# Patient Record
Sex: Female | Born: 1970 | Race: Black or African American | Hispanic: No | Marital: Married
Health system: Southern US, Community
[De-identification: ages and names within clinical notes are randomized; demographics above are authoritative.]

## PROBLEM LIST (undated history)

## (undated) DIAGNOSIS — M1990 Unspecified osteoarthritis, unspecified site: Secondary | ICD-10-CM

## (undated) DIAGNOSIS — I10 Essential (primary) hypertension: Secondary | ICD-10-CM

## (undated) HISTORY — PX: KNEE SURGERY: SHX244

## (undated) HISTORY — PX: TUBAL LIGATION: SHX77

---

## 2002-10-08 ENCOUNTER — Other Ambulatory Visit: Admission: RE | Admit: 2002-10-08 | Discharge: 2002-10-08 | Payer: Self-pay

## 2003-09-11 ENCOUNTER — Inpatient Hospital Stay (HOSPITAL_COMMUNITY): Admission: RE | Admit: 2003-09-11 | Discharge: 2003-09-14 | Payer: Self-pay | Admitting: Orthopedic Surgery

## 2014-07-03 ENCOUNTER — Other Ambulatory Visit: Payer: Self-pay | Admitting: Orthopedic Surgery

## 2014-07-03 ENCOUNTER — Ambulatory Visit (HOSPITAL_COMMUNITY)
Admission: RE | Admit: 2014-07-03 | Discharge: 2014-07-03 | Disposition: A | Discharge status: Home / Self Care | Payer: Self-pay | Source: Ambulatory Visit | Attending: Orthopedic Surgery | Admitting: Orthopedic Surgery

## 2014-07-03 DIAGNOSIS — M25572 Pain in left ankle and joints of left foot: Secondary | ICD-10-CM | POA: Insufficient documentation

## 2014-07-03 DIAGNOSIS — M25571 Pain in right ankle and joints of right foot: Secondary | ICD-10-CM | POA: Insufficient documentation

## 2014-07-08 ENCOUNTER — Encounter: Payer: Self-pay | Admitting: Orthopedic Surgery

## 2014-07-08 ENCOUNTER — Ambulatory Visit (INDEPENDENT_AMBULATORY_CARE_PROVIDER_SITE_OTHER): Payer: BLUE CROSS/BLUE SHIELD | Admitting: Orthopedic Surgery

## 2014-07-08 VITALS — BP 133/83 | Ht 66.0 in | Wt 167.0 lb

## 2014-07-08 DIAGNOSIS — M722 Plantar fascial fibromatosis: Secondary | ICD-10-CM | POA: Diagnosis not present

## 2014-07-08 NOTE — Progress Notes (Signed)
New  Chief Complaint  Patient presents with  . Foot Pain    bilateral foot pain    43 y.o. History this is a [age] year old female 6670 month history of atraumatic pain in the plantar aspect of both heels. This started on the right is now progressed to the left. She describes pain swelling burning stabbing aching radiating sensation which is worse in the morning. She rates her pain 8 out of 10. She took Neurontin tried some rest and some topiramate but no improvement. Pain is worse with walking and with pressure and long sitting and then getting back up  Review of systems sinusitis joint pain swollen joints back pain otherwise normal  Previous medical history arthritis  Left and right knee surgeries tubal ligation actually had 2 left knee surgeries 1 2000 03/11/2003 medications are recorded  Allergies none  Family history diabetes stroke hypertension cancer arthritis  Nonsmoker occasional social drinker. She is employed as a Programmer, multimedianature assistant  BP 133/83 mmHg  Ht 5\' 6"  (1.676 m)  Wt 167 lb (75.751 kg)  BMI 26.97 kg/m2  LMP 06/17/2014 (Approximate) Appearance is normal she is oriented 3 grooming is excellent mood is normal gait is antalgic both heels  She can't really stand flat-footed. She has a normal arch. Ankle range of motion is normal stability of both ankles is normal motor exam is normal she has tenderness in the plantar aspect of both heels posterior tibial tendon is nontender scans intact no rash sensation and vascularity are normal bilaterally  X-rays of both ankles including a lateral of the heel show mild plantar spur on the left side  Impression plantar fasciitis  Recommend tuli  heel cups and physical therapy follow-up in 6 weeks

## 2014-07-08 NOTE — Patient Instructions (Addendum)
Take prescription to [facility] for heel cups  Call [facility] therapy dept to schedule visits

## 2014-07-21 ENCOUNTER — Telehealth: Payer: Self-pay | Admitting: Orthopedic Surgery

## 2014-07-21 NOTE — Telephone Encounter (Signed)
Yes

## 2014-07-21 NOTE — Telephone Encounter (Signed)
Routing to Dr Harrison 

## 2014-07-21 NOTE — Telephone Encounter (Signed)
Patient is supposed to start PT tomorrow at 4:45 and she states that she is having a lot of heel pain that radiates up into her ankle, there has been swelling over the weekend, which has went down some due to icing an elevation. She is asking if she should go ahead an start Therapy, please advise?

## 2014-07-21 NOTE — Telephone Encounter (Signed)
Patient aware.

## 2014-07-22 ENCOUNTER — Ambulatory Visit (HOSPITAL_COMMUNITY): Payer: BLUE CROSS/BLUE SHIELD | Attending: Orthopedic Surgery | Admitting: Physical Therapy

## 2014-07-22 DIAGNOSIS — M722 Plantar fascial fibromatosis: Secondary | ICD-10-CM | POA: Diagnosis present

## 2014-07-22 DIAGNOSIS — R269 Unspecified abnormalities of gait and mobility: Secondary | ICD-10-CM

## 2014-07-22 DIAGNOSIS — M25571 Pain in right ankle and joints of right foot: Secondary | ICD-10-CM | POA: Insufficient documentation

## 2014-07-22 DIAGNOSIS — M25671 Stiffness of right ankle, not elsewhere classified: Secondary | ICD-10-CM | POA: Diagnosis present

## 2014-07-22 DIAGNOSIS — M25672 Stiffness of left ankle, not elsewhere classified: Secondary | ICD-10-CM | POA: Diagnosis present

## 2014-07-22 DIAGNOSIS — M25572 Pain in left ankle and joints of left foot: Secondary | ICD-10-CM | POA: Insufficient documentation

## 2014-07-22 NOTE — Therapy (Signed)
[facility] [address] [place], [place], [address] Phone: [phone]   Fax:  [phone]  Physical Therapy Evaluation  Patient Details  Name: Jodi Gray MRN: [id number] Date of Birth: 10-10-1970 Referring Provider:  Vickki HearingHarrison, Stanley E, MD  Encounter Date: 07/22/2014      PT End of Session - 07/22/14 1745    Visit Number 1   Number of Visits 12   Date for PT Re-Evaluation 08/19/14   Authorization Type BCBS   Authorization Time Period 07/22/14 to 09/22/14   PT Start Time 1700   PT Stop Time 1740   PT Time Calculation (min) 40 min   Activity Tolerance Patient tolerated treatment well;Patient limited by pain   Behavior [facility] for tasks assessed/performed      No past medical history on file.  No past surgical history on file.  There were no vitals filed for this visit.  Visit Diagnosis:  Bilateral plantar fasciitis - Plan: PT plan of care cert/re-cert  Pain in joint, ankle and foot, left - Plan: PT plan of care cert/re-cert  Pain in joint, ankle and foot, right - Plan: PT plan of care cert/re-cert  Ankle and/or foot joint stiffness, left - Plan: PT plan of care cert/re-cert  Ankle and/or foot joint stiffness, right - Plan: PT plan of care cert/re-cert  Abnormality of gait - Plan: PT plan of care cert/re-cert      Subjective Assessment - 07/22/14 1702    Subjective L is usually more painful but patient still notices it in the right foot; pain can get up to 10/10 pain during the day. 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    Pertinent History Has had L knee replaced, also had [place] procedure done to R knee in past; L knee has trouble bending due to past surgeries. The plantar fasciitis started around 8 months ago; she had been working out elliptical and started having pain in feet. Pain did not diminish and was diagnosed with plantar fasciitis at family doctor. MD recommended new shoes.    How long can you stand comfortably? 5 minutes    How long can you walk comfortably? once she gets going, patient is OK; its the getting started that is the problem   Patient Stated Goals get rid of pain, get rid of swelling   Currently in Pain? Yes   Pain Score 5    Pain Location Foot   Pain Orientation Right;Left            [facility] PT Assessment - 07/22/14 0001    Assessment   Medical Diagnosis plantar fasciitis    Onset Date/Surgical Date --  8 months ago    Next MD Visit August 2016   Precautions   Precautions None   Restrictions   Weight Bearing Restrictions No   Balance Screen   Has the patient fallen in the past 6 months No   Has the patient had a decrease in activity level because of a fear of falling?  Yes   Is the patient reluctant to leave their home because of a fear of falling?  Yes   Prior Function   Level of Independence Independent;Independent with basic ADLs;Independent with gait;Independent with transfers   Vocation Full time employment   Vocation Requirements HR worker    Leisure reading, going to [employer], walking, bowling   Observation/Other Assessments   Focus on Therapeutic Outcomes (FOTO)  62% limited    Posture/Postural  Control   Posture Comments forward head, IR B shoulders, increased lumbar lordosis, reduced weight bearing L LE, low arches bilaterally   AROM   Right Hip External Rotation  --  full range    Right Hip Internal Rotation  35   Left Hip External Rotation  45   Left Hip Internal Rotation  40   Right Ankle Dorsiflexion 10   Right Ankle Plantar Flexion --  full range    Right Ankle Inversion 15   Right Ankle Eversion 15   Left Ankle Dorsiflexion 3   Left Ankle Plantar Flexion --  full range but painful    Left Ankle Inversion 16   Left Ankle Eversion 15   Strength   Right Hip Flexion 4/5   Right Hip Extension 4-/5   Right Hip ABduction 4-/5   Left Hip Flexion 5/5   Left Hip Extension 4-/5   Left Hip ABduction 4-/5   Right Knee  Flexion 4/5   Right Knee Extension 3+/5   Left Knee Flexion 5/5   Left Knee Extension 5/5   Right Ankle Dorsiflexion 5/5   Left Ankle Dorsiflexion 5/5   Palpation   Palpation comment moderate talar tightness L, mild calcaneal and midfoot tightness L; very mild tightness R foot in general    Ambulation/Gait   Gait Comments reduced gait speed, reduced push off and heel strike respectively, significant pronation bilaterally, proximal muscle weakness                            PT Education - 07/22/14 1744    Education provided Yes   Education Details prognosis, plan of care, HEP    Person(s) Educated Patient   Methods Explanation;Handout   Comprehension Verbalized understanding;Need further instruction          PT Short Term Goals - 07/22/14 1749    PT SHORT TERM GOAL #1   Title Patient will compliain of no more than 2/10 pain bilateral ankles and feet during all weight bearing activities    Time 3   Period Weeks   Status New   PT SHORT TERM GOAL #2   Title Patient will demonstrate bilateral ankle dorsiflexion of 20 degrees and increase bilateral inversion/eversion by at least 5 degrees    Time 3   Period Weeks   Status New   PT SHORT TERM GOAL #3   Title Patient will demonstrate the ability to bear weight equally down through lower extremities during gait with improved push off and heel strike, equal step lengths for unlimited distances    Time 3   Period Weeks   Status New   PT SHORT TERM GOAL #4   Title Patient will be independent in correctly and consistently performing appropriate HEP, to be updated PRN    Time 3   Period Weeks   Status New           PT Long Term Goals - 07/22/14 1751    PT LONG TERM GOAL #1   Title Patient will experience 0/10 pain in bilateral feet with no lateral pain L ankle during all functional weight bearing tasks for unlimited amounts of time    Time 6   Period Weeks   Status New   PT LONG TERM GOAL #2   Title  Patient will be able to perform static stance activites for at least 60 minnutes with pain 0/10 and equal weight bearing    Time 6  Period Weeks   Status New   PT LONG TERM GOAL #3   Title Patient will be able to ambulate over even and uneven surfaces with pain 0/10 for unlimited amounts of time, no fatigue, WNL gait mechanics    Time 6   Period Weeks   Status New   PT LONG TERM GOAL #4   Title Patient will be able to maintain SLS on foam pad with no HHA for at least 30 seconds each LE with pain no more than 1/10   Time 6   Period Weeks   Status New   PT LONG TERM GOAL #5   Title Patient will report that she has been able to exercise on elliptical machine for at least 20 minutes a day, at least 3 days a week with pain no more than 1/10 during or after activity    Time 6   Period Weeks   Status New               Plan - 07/22/14 1745    Clinical Impression Statement Patient presents with bilateral plantar fasciitis L greater than R, bilateral ankle and hip stiffness, pain bilateral ankles L greater than R, impaired posture, impaired gait, and reduced functional activity tolerance due to pain. Patient reports that her L is much worse than the R and that she also has new pain running up the lateral side of her L ankle, which PT will monitor and address if needed during her course of care. Also note that she displays significant edema in bialteral ankles at this time, again L greater than R. Patient wil benefit from skilled PT services in order to address these deficits and assist her in reaching an optimal level of function.    Pt will benefit from skilled therapeutic intervention in order to improve on the following deficits Abnormal gait;Decreased coordination;Decreased range of motion;Difficulty walking;Increased fascial restricitons;Decreased endurance;Decreased activity tolerance;Pain;Decreased balance;Hypomobility;Decreased mobility;Decreased strength;Increased edema;Postural  dysfunction   Rehab Potential Good   PT Frequency 2x / week   PT Duration 6 weeks   PT Treatment/Interventions ADLs/Self Care Home Management;Moist Heat;Cryotherapy;Ultrasound;Gait training;Stair training;Functional mobility training;Therapeutic activities;Therapeutic exercise;Balance training;Neuromuscular re-education;Patient/family education;Manual techniques   PT Next Visit Plan review HEP and goals; functional stretching, manual, exercises as tolerated    PT Home Exercise Plan given    Consulted and Agree with Plan of Care Patient         Problem List There are no active problems to display for this patient.   Nedra Hai PT, DPT [phone]  [facility] [address] [place], [place], [address] Phone: [phone]   Fax:  [phone]

## 2014-07-22 NOTE — Patient Instructions (Signed)
Ankle Pumps   Lie with knees supported on bolster or sit. Straighten one knee. Keep knee straight; alternately press out and pull up heel. Emphasize movement of heel. Repeat _10__ times, twice a day.   Copyright  VHI. All rights reserved.   Ankle Eversion   With ankle movement only, move left foot so sole of foot faces outward. Repeat __10__ times per session. Do __2__ sessions per day . Position: Sitting   Copyright  VHI. All rights reserved.   Ankle Inversion   With ankle movement only, move left foot so sole of foot faces inward. Repeat __10__ times per session. Do _2___ sessions per day. Position: Seated  Copyright  VHI. All rights reserved.   Ankle Circles   Slowly rotate right foot and ankle clockwise then counterclockwise. Gradually increase range of motion. Avoid pain. Circle __10__ times each direction per set. Do _1___ sets per session. Do _2___ sessions per day.  http://orth.exer.us/31   Copyright  VHI. All rights reserved.   STRETCH FOR THE FOOT  Stand at your stairs with just your balls on the stair and let your heels hang off of the side, so that you feel a stretch in the bottom of your feet. Hold 30 seconds, 2 times, twice a day.

## 2014-07-23 ENCOUNTER — Ambulatory Visit (HOSPITAL_COMMUNITY): Payer: BLUE CROSS/BLUE SHIELD | Admitting: Physical Therapy

## 2014-07-23 DIAGNOSIS — M25671 Stiffness of right ankle, not elsewhere classified: Secondary | ICD-10-CM

## 2014-07-23 DIAGNOSIS — M722 Plantar fascial fibromatosis: Secondary | ICD-10-CM | POA: Diagnosis not present

## 2014-07-23 DIAGNOSIS — M25571 Pain in right ankle and joints of right foot: Secondary | ICD-10-CM

## 2014-07-23 DIAGNOSIS — M25572 Pain in left ankle and joints of left foot: Secondary | ICD-10-CM

## 2014-07-23 DIAGNOSIS — R269 Unspecified abnormalities of gait and mobility: Secondary | ICD-10-CM

## 2014-07-23 DIAGNOSIS — M25672 Stiffness of left ankle, not elsewhere classified: Secondary | ICD-10-CM

## 2014-07-23 NOTE — Therapy (Signed)
[facility] [address] [place], [place], [address] Phone: [phone]   Fax:  [phone]  Physical Therapy Treatment  Patient Details  Name: Jodi Gray MRN: [id number] Date of Birth: 08-14-1970 Referring Provider:  Vickki HearingHarrison, Stanley E, MD  Encounter Date: 07/23/2014      PT End of Session - 07/23/14 1635    Visit Number 2   Number of Visits 12   Date for PT Re-Evaluation 08/19/14   Authorization Type BCBS   Authorization Time Period 07/22/14 to 09/22/14   PT Start Time 1530   PT Stop Time 1615   PT Time Calculation (min) 45 min   Activity Tolerance Patient tolerated treatment well;Patient limited by pain   Behavior During Therapy [facility] for tasks assessed/performed      No past medical history on file.  No past surgical history on file.  There were no vitals filed for this visit.  Visit Diagnosis:  Bilateral plantar fasciitis  Pain in joint, ankle and foot, left  Pain in joint, ankle and foot, right  Ankle and/or foot joint stiffness, left  Ankle and/or foot joint stiffness, right  Abnormality of gait      Subjective Assessment - 07/23/14 1629    Subjective Pt reports she is late due to waiting on her ride to get here.  States her Lt foot is hurting 6/10 and her Rt is not hurting at all today.  PT is just coming from work.  States she's been doing her HEP and is planning on going to get new shoes.   Currently in Pain? Yes   Pain Score 5    Pain Location Foot   Pain Orientation Left   Pain Descriptors / Indicators Burning                         [facility] Adult PT Treatment/Exercise - 07/23/14 1632    Modalities   Modalities Cryotherapy   Cryotherapy   Number Minutes Cryotherapy 10 Minutes   Cryotherapy Location Other (comment)   Type of Cryotherapy Ice massage   Manual Therapy   Manual Therapy Joint mobilization;Soft tissue mobilization;Myofascial release   Manual therapy comments focus to  lateral heel and moving towards plantar fascia Lt foot in prone with stretch   Joint Mobilization to increase forefoot and ankle mobility   Ankle Exercises: Stretches   Plantar Fascia Stretch 3 reps;30 seconds   Plantar Fascia Stretch Limitations bottom step   Gastroc Stretch 3 reps;30 seconds   Gastroc Stretch Limitations slant board   Other Stretch PROM with manual techniques                PT Education - 07/22/14 1744    Education provided Yes   Education Details Written evaluation measurements and goals   Person(s) Educated Patient   Methods Explanation;Handout   Comprehension Verbalized understanding;Need further instruction          PT Short Term Goals - 07/22/14 1749    PT SHORT TERM GOAL #1   Title Patient will compliain of no more than 2/10 pain bilateral ankles and feet during all weight bearing activities    Time 3   Period Weeks   Status New   PT SHORT TERM GOAL #2   Title Patient will demonstrate bilateral ankle dorsiflexion of 20 degrees and increase bilateral inversion/eversion by at least 5 degrees    Time 3   Period Weeks   Status New  PT SHORT TERM GOAL #3   Title Patient will demonstrate the ability to bear weight equally down through lower extremities during gait with improved push off and heel strike, equal step lengths for unlimited distances    Time 3   Period Weeks   Status New   PT SHORT TERM GOAL #4   Title Patient will be independent in correctly and consistently performing appropriate HEP, to be updated PRN    Time 3   Period Weeks   Status New           PT Long Term Goals - 07/22/14 1751    PT LONG TERM GOAL #1   Title Patient will experience 0/10 pain in bilateral feet with no lateral pain L ankle during all functional weight bearing tasks for unlimited amounts of time    Time 6   Period Weeks   Status New   PT LONG TERM GOAL #2   Title Patient will be able to perform static stance activites for at least 60 minnutes with  pain 0/10 and equal weight bearing    Time 6   Period Weeks   Status New   PT LONG TERM GOAL #3   Title Patient will be able to ambulate over even and uneven surfaces with pain 0/10 for unlimited amounts of time, no fatigue, WNL gait mechanics    Time 6   Period Weeks   Status New   PT LONG TERM GOAL #4   Title Patient will be able to maintain SLS on foam pad with no HHA for at least 30 seconds each LE with pain no more than 1/10   Time 6   Period Weeks   Status New   PT LONG TERM GOAL #5   Title Patient will report that she has been able to exercise on elliptical machine for at least 20 minutes a day, at least 3 days a week with pain no more than 1/10 during or after activity    Time 6   Period Weeks   Status New               Plan - 07/23/14 1641    Clinical Impression Statement Pt given copy of initial evaluation with explanation of measurements and goals.  Initiated stretches for plantar fascia and gastroc musculature.  Pt with mild edema noted in Lt lateral ankle but no other areas of swelling noted.  Completed manual therapy including myofascial techniques, soft tissue massage and joint mobs to forefoot and ankle.  Most tenderness and pain located in lateral Lt heel today.  Added ice massage to this area at end of session to further decrease the pain.  Pt reported no difference in pain at end of session    PT Next Visit Plan Progress with functional stretching, manual, exercises as tolerated .  Update HEP as needed.   Consulted and Agree with Plan of Care Patient        Problem List There are no active problems to display for this patient.   Lurena Nida, PTA/CLT [phone]  07/23/2014, 4:46 PM  [facility] [address] [place], [place], [address] Phone: [phone]   Fax:  [phone]

## 2014-07-24 ENCOUNTER — Encounter: Payer: Self-pay | Admitting: Orthopedic Surgery

## 2014-07-29 ENCOUNTER — Ambulatory Visit (HOSPITAL_COMMUNITY): Payer: BLUE CROSS/BLUE SHIELD | Admitting: Physical Therapy

## 2014-07-29 DIAGNOSIS — M25671 Stiffness of right ankle, not elsewhere classified: Secondary | ICD-10-CM

## 2014-07-29 DIAGNOSIS — M722 Plantar fascial fibromatosis: Secondary | ICD-10-CM

## 2014-07-29 DIAGNOSIS — M25672 Stiffness of left ankle, not elsewhere classified: Secondary | ICD-10-CM

## 2014-07-29 DIAGNOSIS — M25572 Pain in left ankle and joints of left foot: Secondary | ICD-10-CM

## 2014-07-29 DIAGNOSIS — M25571 Pain in right ankle and joints of right foot: Secondary | ICD-10-CM

## 2014-07-29 NOTE — Therapy (Signed)
[facility] [address] [place], [place], [address] Phone: [phone]   Fax:  [phone]  Physical Therapy Treatment  Patient Details  Name: Jodi Gray MRN: [id number] Date of Birth: 1970/08/22 Referring Provider:  Vickki HearingHarrison, Stanley E, MD  Encounter Date: 07/29/2014      PT End of Session - 07/29/14 1207    Visit Number 3   Number of Visits 12   Date for PT Re-Evaluation 08/19/14   Authorization Type BCBS   Authorization Time Period 07/22/14 to 09/22/14   PT Start Time 1015   PT Stop Time 1057   PT Time Calculation (min) 42 min   Activity Tolerance Patient tolerated treatment well   Behavior During Therapy [facility] for tasks assessed/performed      No past medical history on file.  No past surgical history on file.  There were no vitals filed for this visit.  Visit Diagnosis:  Bilateral plantar fasciitis  Pain in joint, ankle and foot, left  Pain in joint, ankle and foot, right  Ankle and/or foot joint stiffness, left  Ankle and/or foot joint stiffness, right      Subjective Assessment - 07/29/14 1017    Subjective Pt reports that she has pain in bilateral feet today, which is the worst when she is walking. She rates pain as 8/10 when she is ambulating. Pt reports that the pain in her R foot was mild until she began PT.    Currently in Pain? Yes   Pain Score 8    Pain Location Foot   Pain Orientation Right;Left   Pain Descriptors / Indicators Burning             [facility] Adult PT Treatment/Exercise - 07/29/14 0001    Manual Therapy   Manual Therapy Joint mobilization;Soft tissue mobilization;Myofascial release   Manual therapy comments focus to lateral heel and moving towards plantar fascia Lt foot in prone with stretch   Joint Mobilization to increase forefoot and ankle mobility   Ankle Exercises: Stretches   Plantar Fascia Stretch 3 reps;30 seconds   Plantar Fascia Stretch Limitations bottom step   Gastroc Stretch 3 reps;30 seconds   Gastroc Stretch Limitations slant board   Other Stretch PROM with manual techniques   Ankle Exercises: Standing   Rocker Board 2 minutes  A/P   Other Standing Ankle Exercises standing ankle excursions x 10                PT Education - 07/29/14 1206    Education provided Yes   Education Details Educated on using tennis ball or frozen water bottle to decrease tightness in plantar fascia   Person(s) Educated Patient   Methods Explanation   Comprehension Verbalized understanding          PT Short Term Goals - 07/22/14 1749    PT SHORT TERM GOAL #1   Title Patient will compliain of no more than 2/10 pain bilateral ankles and feet during all weight bearing activities    Time 3   Period Weeks   Status New   PT SHORT TERM GOAL #2   Title Patient will demonstrate bilateral ankle dorsiflexion of 20 degrees and increase bilateral inversion/eversion by at least 5 degrees    Time 3   Period Weeks   Status New   PT SHORT TERM GOAL #3   Title Patient will demonstrate the ability to bear weight equally down through lower extremities during gait with improved push off and heel  strike, equal step lengths for unlimited distances    Time 3   Period Weeks   Status New   PT SHORT TERM GOAL #4   Title Patient will be independent in correctly and consistently performing appropriate HEP, to be updated PRN    Time 3   Period Weeks   Status New           PT Long Term Goals - 07/22/14 1751    PT LONG TERM GOAL #1   Title Patient will experience 0/10 pain in bilateral feet with no lateral pain L ankle during all functional weight bearing tasks for unlimited amounts of time    Time 6   Period Weeks   Status New   PT LONG TERM GOAL #2   Title Patient will be able to perform static stance activites for at least 60 minnutes with pain 0/10 and equal weight bearing    Time 6   Period Weeks   Status New   PT LONG TERM GOAL #3   Title Patient will  be able to ambulate over even and uneven surfaces with pain 0/10 for unlimited amounts of time, no fatigue, WNL gait mechanics    Time 6   Period Weeks   Status New   PT LONG TERM GOAL #4   Title Patient will be able to maintain SLS on foam pad with no HHA for at least 30 seconds each LE with pain no more than 1/10   Time 6   Period Weeks   Status New   PT LONG TERM GOAL #5   Title Patient will report that she has been able to exercise on elliptical machine for at least 20 minutes a day, at least 3 days a week with pain no more than 1/10 during or after activity    Time 6   Period Weeks   Status New               Plan - 07/29/14 1209    Clinical Impression Statement Treatment focused on improving range of motion and mobility of bilateral feet/ankles. Pt responded well to manual therapy, reporting decreased pain with soft tissue mobilization, however, her pain increased again with ankle excursions. Pt will benefit from continued manual therapy and ankle ROM activities   PT Next Visit Plan Continue with manual, add ankle ABCs and seated BAPS        Problem List There are no active problems to display for this patient.   Leona Singleton, PT, DPT [phone]  07/29/2014, 12:15 PM  [facility] [address] [place], [place], [address] Phone: [phone]   Fax:  [phone]

## 2014-08-01 ENCOUNTER — Ambulatory Visit (HOSPITAL_COMMUNITY): Payer: BLUE CROSS/BLUE SHIELD

## 2014-08-01 DIAGNOSIS — M25572 Pain in left ankle and joints of left foot: Secondary | ICD-10-CM

## 2014-08-01 DIAGNOSIS — M722 Plantar fascial fibromatosis: Secondary | ICD-10-CM | POA: Diagnosis not present

## 2014-08-01 DIAGNOSIS — M25672 Stiffness of left ankle, not elsewhere classified: Secondary | ICD-10-CM

## 2014-08-01 DIAGNOSIS — M25571 Pain in right ankle and joints of right foot: Secondary | ICD-10-CM

## 2014-08-01 DIAGNOSIS — R269 Unspecified abnormalities of gait and mobility: Secondary | ICD-10-CM

## 2014-08-01 DIAGNOSIS — M25671 Stiffness of right ankle, not elsewhere classified: Secondary | ICD-10-CM

## 2014-08-01 NOTE — Therapy (Signed)
[facility] [address] [place], [place], [address] Phone: [phone]   Fax:  [phone]  Physical Therapy Treatment  Patient Details  Name: Jodi Gray MRN: [id number] Date of Birth: 16-Aug-1970 Referring Provider:  Vickki Hearing, MD  Encounter Date: 08/01/2014      PT End of Session - 08/01/14 1729    Visit Number 4   Number of Visits 12   Date for PT Re-Evaluation 08/19/14   Authorization Type BCBS   Authorization Time Period 07/22/14 to 09/22/14   PT Start Time 1730   PT Stop Time 1825   PT Time Calculation (min) 55 min   Activity Tolerance Patient tolerated treatment well;Patient limited by pain   Behavior During Therapy [facility] for tasks assessed/performed      No past medical history on file.  No past surgical history on file.  There were no vitals filed for this visit.  Visit Diagnosis:  Bilateral plantar fasciitis  Pain in joint, ankle and foot, left  Pain in joint, ankle and foot, right  Ankle and/or foot joint stiffness, left  Ankle and/or foot joint stiffness, right  Abnormality of gait      Subjective Assessment - 08/01/14 1730    Subjective Pt stated she has been at work 8 hours today with increased pain Rt>Lt feet with burning, increased pain with weight bearing.  Lt foot is swollen today, reports she has been applying ice for relief   Currently in Pain? Yes   Pain Score 9    Pain Location Ankle   Pain Orientation Right;Left   Pain Descriptors / Indicators Burning;Pins and needles              [facility] Adult PT Treatment/Exercise - 08/01/14 0001    Exercises   Exercises Ankle   Manual Therapy   Manual Therapy Joint mobilization;Soft tissue mobilization;Myofascial release   Manual therapy comments focus to lateral heel and moving towards plantar fascia Lt foot in prone with stretch   Joint Mobilization to increase forefoot and ankle mobility   Ankle Exercises: Stretches   Plantar  Fascia Stretch 3 reps;30 seconds   Plantar Fascia Stretch Limitations bottom step   Gastroc Stretch 3 reps;30 seconds   Gastroc Stretch Limitations slant board   Other Stretch PROM with manual techniques   Ankle Exercises: Standing   Rocker Board 2 minutes  R/L and A/P   Other Standing Ankle Exercises standing ankle excursions x 10   Ankle Exercises: Seated   ABC's 2 reps   ABC's Limitations therapist facilitation to reduce compensation   BAPS Sitting;Level 2;5 reps   BAPS Limitations R/L, A/P, CW/CCW                  PT Short Term Goals - 08/01/14 1729    PT SHORT TERM GOAL #1   Title Patient will compliain of no more than 2/10 pain bilateral ankles and feet during all weight bearing activities    PT SHORT TERM GOAL #2   Title Patient will demonstrate bilateral ankle dorsiflexion of 20 degrees and increase bilateral inversion/eversion by at least 5 degrees    PT SHORT TERM GOAL #3   Title Patient will demonstrate the ability to bear weight equally down through lower extremities during gait with improved push off and heel strike, equal step lengths for unlimited distances    PT SHORT TERM GOAL #4   Title Patient will be independent in correctly and consistently performing appropriate HEP, to  be updated PRN    Status On-going           PT Long Term Goals - 08/01/14 1730    PT LONG TERM GOAL #1   Title Patient will experience 0/10 pain in bilateral feet with no lateral pain L ankle during all functional weight bearing tasks for unlimited amounts of time    PT LONG TERM GOAL #2   Title Patient will be able to perform static stance activites for at least 60 minnutes with pain 0/10 and equal weight bearing    PT LONG TERM GOAL #3   Title Patient will be able to ambulate over even and uneven surfaces with pain 0/10 for unlimited amounts of time, no fatigue, WNL gait mechanics    PT LONG TERM GOAL #4   Title Patient will be able to maintain SLS on foam pad with no HHA for  at least 30 seconds each LE with pain no more than 1/10   PT LONG TERM GOAL #5   Title Patient will report that she has been able to exercise on elliptical machine for at least 20 minutes a day, at least 3 days a week with pain no more than 1/10 during or after activity                Plan - 08/01/14 1828    Clinical Impression Statement Session focus on improving Bil ankle mobilty to improve ROM and manual technqiues for pain control.  Added seated BAPS board and ankle ABC's to improve ROM with therapist facilitation to reduce compensations with hip mobilty.  Ended session with manual therapy with PROM per tolerance and MFR techniques to reduce fascial restrictions with increased tenderness to Lt heel.  End of session pt reported decrease in pain scale to 3-4/10 Bil ankles.     PT Next Visit Plan Continue with ankle mobiltyi to improve ROM, review ABCs to assure ankle movements with no compensation, increase BAPS board to L3 and continue manual.          Problem List There are no active problems to display for this patient.  [address] LPTA; CBIS [phone]  [name] 08/01/2014, 6:34 PM  [facility] [address] [place], [place], [address] Phone: [phone]   Fax:  [phone]

## 2014-08-05 ENCOUNTER — Ambulatory Visit (HOSPITAL_COMMUNITY): Payer: BLUE CROSS/BLUE SHIELD | Admitting: Physical Therapy

## 2014-08-12 ENCOUNTER — Ambulatory Visit (HOSPITAL_COMMUNITY): Payer: BLUE CROSS/BLUE SHIELD | Attending: Orthopedic Surgery

## 2014-08-12 DIAGNOSIS — M722 Plantar fascial fibromatosis: Secondary | ICD-10-CM

## 2014-08-12 DIAGNOSIS — M25572 Pain in left ankle and joints of left foot: Secondary | ICD-10-CM | POA: Diagnosis present

## 2014-08-12 DIAGNOSIS — R269 Unspecified abnormalities of gait and mobility: Secondary | ICD-10-CM

## 2014-08-12 DIAGNOSIS — M25672 Stiffness of left ankle, not elsewhere classified: Secondary | ICD-10-CM | POA: Insufficient documentation

## 2014-08-12 DIAGNOSIS — M25671 Stiffness of right ankle, not elsewhere classified: Secondary | ICD-10-CM

## 2014-08-12 DIAGNOSIS — M25571 Pain in right ankle and joints of right foot: Secondary | ICD-10-CM | POA: Insufficient documentation

## 2014-08-12 NOTE — Therapy (Signed)
[facility] [address] [place], [place], [address] Phone: [phone]   Fax:  [phone]  Physical Therapy Treatment  Patient Details  Name: Jodi Gray MRN: [id number] Date of Birth: 1970-04-02 Referring Provider:  Toma Deiters, MD  Encounter Date: 08/12/2014      PT End of Session - 08/12/14 1701    Visit Number 5   Number of Visits 12   Date for PT Re-Evaluation 08/19/14   Authorization Type BCBS   Authorization Time Period 07/22/14 to 09/22/14   PT Start Time 1650   PT Stop Time 1730   PT Time Calculation (min) 40 min   Activity Tolerance Patient tolerated treatment well   Behavior During Therapy [facility] for tasks assessed/performed      No past medical history on file.  No past surgical history on file.  There were no vitals filed for this visit.  Visit Diagnosis:  Bilateral plantar fasciitis  Pain in joint, ankle and foot, left  Pain in joint, ankle and foot, right  Ankle and/or foot joint stiffness, left  Ankle and/or foot joint stiffness, right  Abnormality of gait      Subjective Assessment - 08/12/14 1652    Subjective Pt stated she was increased pain last week, went to podiatrist and got shot in Bil plantar fascia ankles.     Currently in Pain? Yes   Pain Score 2    Pain Location Ankle   Pain Orientation Right;Left   Pain Descriptors / Indicators Burning             [facility] Adult PT Treatment/Exercise - 08/12/14 0001    Modalities   Modalities Ultrasound   Ultrasound   Ultrasound Location BLE heel    Ultrasound Parameters 1.2 w/cm2 100% ; each   Ultrasound Goals Pain   Manual Therapy   Manual Therapy Joint mobilization;Soft tissue mobilization;Myofascial release   Manual therapy comments focus to lateral heel and moving towards plantar fascia Lt foot in prone with stretch   Joint Mobilization to increase forefoot and ankle mobility   Ankle Exercises: Stretches   Plantar Fascia  Stretch 3 reps;30 seconds   Plantar Fascia Stretch Limitations bottom step   Gastroc Stretch 3 reps;30 seconds   Gastroc Stretch Limitations slant board   Other Stretch PROM with manual techniques   Ankle Exercises: Seated   ABC's 1 rep  Bil ankles   BAPS Sitting;Level 3;10 reps   BAPS Limitations R/L, A/P, CW/CCW                  PT Short Term Goals - 08/12/14 1701    PT SHORT TERM GOAL #1   Title Patient will compliain of no more than 2/10 pain bilateral ankles and feet during all weight bearing activities    Status On-going   PT SHORT TERM GOAL #2   Title Patient will demonstrate bilateral ankle dorsiflexion of 20 degrees and increase bilateral inversion/eversion by at least 5 degrees    Status On-going   PT SHORT TERM GOAL #3   Title Patient will demonstrate the ability to bear weight equally down through lower extremities during gait with improved push off and heel strike, equal step lengths for unlimited distances    Status On-going   PT SHORT TERM GOAL #4   Title Patient will be independent in correctly and consistently performing appropriate HEP, to be updated PRN    Status On-going  PT Long Term Goals - 08/12/14 1703    PT LONG TERM GOAL #1   Title Patient will experience 0/10 pain in bilateral feet with no lateral pain L ankle during all functional weight bearing tasks for unlimited amounts of time    PT LONG TERM GOAL #2   Title Patient will be able to perform static stance activites for at least 60 minnutes with pain 0/10 and equal weight bearing    PT LONG TERM GOAL #3   Title Patient will be able to ambulate over even and uneven surfaces with pain 0/10 for unlimited amounts of time, no fatigue, WNL gait mechanics    PT LONG TERM GOAL #4   Title Patient will be able to maintain SLS on foam pad with no HHA for at least 30 seconds each LE with pain no more than 1/10   PT LONG TERM GOAL #5   Title Patient will report that she has been able to  exercise on elliptical machine for at least 20 minutes a day, at least 3 days a week with pain no more than 1/10 during or after activity                Plan - 08/12/14 1742    Clinical Impression Statement Continued session focus on improving Bil ankle mobility, ROM and pain control.  Increased to L3 seated BAPS board to improve ankle ROM with therapist facilitation to reduce compensation with hip and knee mobility.  Pt stated increased pain following manual last session, following discussion with DPT added continuous [place] to plantar fascia aspect BLE for pain controll, did continue with PROM stretches to improve ankle mobility  End of session pt stated pain reduced to not even 1/10 Bil ankles.     PT Next Visit Plan Continue with ankle mobiltiy to increase ROM, F/u with [place] next session.         Problem List There are no active problems to display for this patient.  [address] LPTA; CBIS [phone]  [name] 08/12/2014, 5:51 PM  [facility] [address] [place], [place], [address] Phone: [phone]   Fax:  [phone]

## 2014-08-13 ENCOUNTER — Ambulatory Visit (HOSPITAL_COMMUNITY): Payer: BLUE CROSS/BLUE SHIELD | Admitting: Physical Therapy

## 2014-08-13 ENCOUNTER — Telehealth (HOSPITAL_COMMUNITY): Payer: Self-pay | Admitting: Physical Therapy

## 2014-08-13 NOTE — Telephone Encounter (Signed)
Pt called she is in too much pain to come in today

## 2014-08-15 ENCOUNTER — Encounter: Payer: Self-pay | Admitting: Orthopedic Surgery

## 2014-08-18 ENCOUNTER — Ambulatory Visit (HOSPITAL_COMMUNITY): Payer: BLUE CROSS/BLUE SHIELD | Admitting: Physical Therapy

## 2014-08-18 DIAGNOSIS — M722 Plantar fascial fibromatosis: Secondary | ICD-10-CM

## 2014-08-18 DIAGNOSIS — R269 Unspecified abnormalities of gait and mobility: Secondary | ICD-10-CM

## 2014-08-18 DIAGNOSIS — M25672 Stiffness of left ankle, not elsewhere classified: Secondary | ICD-10-CM

## 2014-08-18 DIAGNOSIS — M25671 Stiffness of right ankle, not elsewhere classified: Secondary | ICD-10-CM

## 2014-08-18 DIAGNOSIS — M25572 Pain in left ankle and joints of left foot: Secondary | ICD-10-CM

## 2014-08-18 DIAGNOSIS — M25571 Pain in right ankle and joints of right foot: Secondary | ICD-10-CM

## 2014-08-18 NOTE — Therapy (Signed)
[facility] [address] [place], [place], [address] Phone: [phone]   Fax:  [phone]  Physical Therapy Treatment  Patient Details  Name: Jodi Gray MRN: [id number] Date of Birth: December 03, 1970 Referring Provider:  Vickki Hearing, MD  Encounter Date: 08/18/2014      PT End of Session - 08/18/14 1754    Visit Number 6   Number of Visits 12   Date for PT Re-Evaluation 08/19/14   Authorization Type BCBS   Authorization Time Period 07/22/14 to 09/22/14   PT Start Time 1700   PT Stop Time 1747   PT Time Calculation (min) 47 min   Activity Tolerance Patient tolerated treatment well   Behavior During Therapy [facility] for tasks assessed/performed      No past medical history on file.  No past surgical history on file.  There were no vitals filed for this visit.  Visit Diagnosis:  Bilateral plantar fasciitis  Pain in joint, ankle and foot, left  Pain in joint, ankle and foot, right  Ankle and/or foot joint stiffness, left  Ankle and/or foot joint stiffness, right  Abnormality of gait      Subjective Assessment - 08/18/14 1702    Subjective Patient reports she is having increased pain today, around 5/10; reports that the shots worked well for the first few days and then things were back to the same    Pertinent History Has had L knee replaced, also had [place] procedure done to R knee in past; L knee has trouble bending due to past surgeries. The plantar fasciitis started around 8 months ago; she had been working out elliptical and started having pain in feet. Pain did not diminish and was diagnosed with plantar fasciitis at family doctor. MD recommended new shoes.    Currently in Pain? Yes   Pain Score 5    Pain Orientation Right;Left  left worse than R                          [facility] Adult PT Treatment/Exercise - 08/18/14 0001    Manual Therapy   Manual Therapy Joint mobilization   Joint  Mobilization midfoot mobilzation, calcaneal mobilzation, talar mobilization, cuboid whip mobilizations alll grade 3 to tolerance    Ankle Exercises: Stretches   Plantar Fascia Stretch 3 reps;30 seconds   Plantar Fascia Stretch Limitations bottom step   Gastroc Stretch 3 reps;30 seconds   Gastroc Stretch Limitations slant board   Other Stretch hamstring and piriformis stretches 2x30 each side    Ankle Exercises: Seated   ABC's 2 reps   BAPS Sitting;Level 3   BAPS Limitations CCW, CW, R/L, A/P   Other Seated Ankle Exercises 3D ankle excursions 1x15   Ankle Exercises: Standing   Rocker Board 2 minutes                PT Education - 08/18/14 1754    Education provided Yes   Education Details educated regarding retrograde massage, contrast baths to reduce edema    Person(s) Educated Patient   Methods Explanation   Comprehension Verbalized understanding          PT Short Term Goals - 08/12/14 1701    PT SHORT TERM GOAL #1   Title Patient will compliain of no more than 2/10 pain bilateral ankles and feet during all weight bearing activities    Status On-going   PT SHORT TERM GOAL #2   Title Patient  will demonstrate bilateral ankle dorsiflexion of 20 degrees and increase bilateral inversion/eversion by at least 5 degrees    Status On-going   PT SHORT TERM GOAL #3   Title Patient will demonstrate the ability to bear weight equally down through lower extremities during gait with improved push off and heel strike, equal step lengths for unlimited distances    Status On-going   PT SHORT TERM GOAL #4   Title Patient will be independent in correctly and consistently performing appropriate HEP, to be updated PRN    Status On-going           PT Long Term Goals - 08/12/14 1703    PT LONG TERM GOAL #1   Title Patient will experience 0/10 pain in bilateral feet with no lateral pain L ankle during all functional weight bearing tasks for unlimited amounts of time    PT LONG TERM  GOAL #2   Title Patient will be able to perform static stance activites for at least 60 minnutes with pain 0/10 and equal weight bearing    PT LONG TERM GOAL #3   Title Patient will be able to ambulate over even and uneven surfaces with pain 0/10 for unlimited amounts of time, no fatigue, WNL gait mechanics    PT LONG TERM GOAL #4   Title Patient will be able to maintain SLS on foam pad with no HHA for at least 30 seconds each LE with pain no more than 1/10   PT LONG TERM GOAL #5   Title Patient will report that she has been able to exercise on elliptical machine for at least 20 minutes a day, at least 3 days a week with pain no more than 1/10 during or after activity                Plan - 08/18/14 1755    Clinical Impression Statement Continued focus on improving bilateral ankle mobility, ROM, and reducing pain; continued exercises on BAPs and slantboard today with cues for facilitation. Trialed introduction of cuboid whip mobilization today with good tolerance by patient. Increased focus on joint mobilization  today with improved gait mechanics afterwards.    Pt will benefit from skilled therapeutic intervention in order to improve on the following deficits Abnormal gait;Decreased coordination;Decreased range of motion;Difficulty walking;Increased fascial restricitons;Decreased endurance;Decreased activity tolerance;Pain;Decreased balance;Hypomobility;Decreased mobility;Decreased strength;Increased edema;Postural dysfunction   Rehab Potential Good   PT Frequency 2x / week   PT Duration 6 weeks   PT Treatment/Interventions ADLs/Self Care Home Management;Moist Heat;Cryotherapy;Ultrasound;Gait training;Stair training;Functional mobility training;Therapeutic activities;Therapeutic exercise;Balance training;Neuromuscular re-education;Patient/family education;Manual techniques   PT Next Visit Plan Re-assess next session.    PT Home Exercise Plan given    Consulted and Agree with Plan of Care  Patient        Problem List There are no active problems to display for this patient.   Nedra Hai PT, DPT [phone]  [facility] [address] [place], [place], [address] Phone: [phone]   Fax:  [phone]

## 2014-08-20 ENCOUNTER — Ambulatory Visit (HOSPITAL_COMMUNITY): Payer: BLUE CROSS/BLUE SHIELD

## 2014-08-20 DIAGNOSIS — M722 Plantar fascial fibromatosis: Secondary | ICD-10-CM

## 2014-08-20 DIAGNOSIS — R269 Unspecified abnormalities of gait and mobility: Secondary | ICD-10-CM

## 2014-08-20 DIAGNOSIS — M25671 Stiffness of right ankle, not elsewhere classified: Secondary | ICD-10-CM

## 2014-08-20 DIAGNOSIS — M25572 Pain in left ankle and joints of left foot: Secondary | ICD-10-CM

## 2014-08-20 DIAGNOSIS — M25571 Pain in right ankle and joints of right foot: Secondary | ICD-10-CM

## 2014-08-20 DIAGNOSIS — M25672 Stiffness of left ankle, not elsewhere classified: Secondary | ICD-10-CM

## 2014-08-20 NOTE — Therapy (Signed)
[place], [place], [address] Phone: [phone]   Fax:  [phone]  Physical Therapy Treatment (Re-Assessment)  Patient Details  Name: Jodi Gray MRN: [id number] Date of Birth: 04-Jan-1971 Referring Provider:  Carole Civil, MD  Encounter Date: 08/20/2014      PT End of Session - 08/20/14 1747    Visit Number 7   Number of Visits 12   Authorization Type BCBS   Authorization Time Period 07/22/14 to 09/22/14   PT Start Time 1740   PT Stop Time 1830   PT Time Calculation (min) 50 min   Activity Tolerance Patient tolerated treatment well   Behavior During Therapy [facility] for tasks assessed/performed      No past medical history on file.  No past surgical history on file.  There were no vitals filed for this visit.  Visit Diagnosis:  Bilateral plantar fasciitis  Pain in joint, ankle and foot, left  Pain in joint, ankle and foot, right  Ankle and/or foot joint stiffness, left  Abnormality of gait  Ankle and/or foot joint stiffness, right      Subjective Assessment - 08/20/14 1745    Subjective Pt stated her feet are feeling good today, pain very minimal today 1-2/10   How long can you stand comfortably? Able to stand comfortably for 15 minutes (5 minutes )   How long can you walk comfortably? once she gets going, patient is OK; its the getting started that is the problem   Currently in Pain? Yes   Pain Score 2    Pain Location Ankle   Pain Orientation Right;Left            [facility] PT Assessment - 08/20/14 0001    Assessment   Medical Diagnosis plantar fasciitis    Onset Date/Surgical Date --  8 months ago   Next MD Visit Aline Brochure 08/21/2014   Observation/Other Assessments   Focus on Therapeutic Outcomes (FOTO)  57% limited was 62%   AROM   Right Hip External Rotation  --  WNL   Right Hip Internal Rotation  42  was 35   Left Hip External Rotation  45   Left Hip Internal  Rotation  42  was 40   Right Ankle Dorsiflexion 13  was 10   Right Ankle Plantar Flexion --  WNL   Right Ankle Inversion 21  was 15   Right Ankle Eversion 18  was 15   Left Ankle Dorsiflexion 12  was 3   Left Ankle Plantar Flexion --  WNL   Left Ankle Inversion 30  was 16   Left Ankle Eversion 15  was 15   Strength   Right Hip Flexion 4/5  was 4/5   Right Hip Extension 4+/5  was 4-/5   Right Hip ABduction 4/5  was 4-85   Left Hip Flexion 5/5  5   Left Hip Extension 4/5  was 4-/53   Left Hip ABduction 4+/5  was 4-/5   Right Knee Flexion 4+/5  was 4/5   Right Knee Extension 4/5  was  3+/5   Left Knee Flexion 5/5   Left Knee Extension 5/5                     [facility] Adult PT Treatment/Exercise - 08/20/14 0001    Manual Therapy   Manual Therapy Joint mobilization   Joint Mobilization midfoot mobilzation, calcaneal mobilzation, talar mobilization, cuboid whip mobilizations alll grade  3 to tolerance    Ankle Exercises: Standing   Rocker Board 2 minutes;Limitations  R/L and A/P   Ankle Exercises: Stretches   Plantar Fascia Stretch 3 reps;30 seconds   Plantar Fascia Stretch Limitations bottom step   Gastroc Stretch 3 reps;30 seconds   Gastroc Stretch Limitations slant board   Ankle Exercises: Seated   BAPS Sitting;Level 3   BAPS Limitations CCW, CW, R/L, A/P                  PT Short Term Goals - 08/20/14 1747    PT SHORT TERM GOAL #1   Title Patient will compliain of no more than 2/10 pain bilateral ankles and feet during all weight bearing activities    Baseline 08/20/2014 Average pain scale 6-8/10, reports pain reduced following stretching and therapy sessions   Status On-going   PT SHORT TERM GOAL #2   Title Patient will demonstrate bilateral ankle dorsiflexion of 20 degrees and increase bilateral inversion/eversion by at least 5 degrees    PT SHORT TERM GOAL #3   Title Patient will demonstrate the ability to bear weight equally down  through lower extremities during gait with improved push off and heel strike, equal step lengths for unlimited distances    Baseline 08/20/2014: Able to demonstrate appropriate gait mechanics inconsistently   Status On-going   PT SHORT TERM GOAL #4   Title Patient will be independent in correctly and consistently performing appropriate HEP, to be updated PRN    Baseline 08/20/2014: Compliance HEP with pain   Status On-going           PT Long Term Goals - 08/20/14 1754    PT LONG TERM GOAL #1   Title Patient will experience 0/10 pain in bilateral feet with no lateral pain L ankle during all functional weight bearing tasks for unlimited amounts of time    Status Not Met   PT LONG TERM GOAL #2   Title Patient will be able to perform static stance activites for at least 60 minnutes with pain 0/10 and equal weight bearing    Status Not Met   PT LONG TERM GOAL #3   Title Patient will be able to ambulate over even and uneven surfaces with pain 0/10 for unlimited amounts of time, no fatigue, WNL gait mechanics    Status Not Met   PT LONG TERM GOAL #4   Title Patient will be able to maintain SLS on foam pad with no HHA for at least 30 seconds each LE with pain no more than 1/10   Status Not Met   PT LONG TERM GOAL #5   Title Patient will report that she has been able to exercise on elliptical machine for at least 20 minutes a day, at least 3 days a week with pain no more than 1/10 during or after activity    Status Not Met   PT LONG TERM GOAL #6   Title Patient will demonstrate the ability to bear weight equally down through lower extremities during gait with improved push off and heel strike, equal step lengths for unlimited distances without shoes on   Baseline 08/20/2014: toe walking without shoes on for fear of pain   Status New               Plan - 08/20/14 1833    Clinical Impression Statement Reassessment complete with the following findings:  Pt reports compliance with HEP  based on pain, pt encouraged to complete stretches  daily for pain control and to improve ROM.  LE strength is progressing.  Pt able to demonstrate appropriate gait mechanics but is inconsistent with proper gait mechanics due to pain with tendency to ambulate with increased supination or toe walking without shoes on.  Pt reports pain relief following therapy session and stretches.  Pt continues to be limited by pain, limited ROM, decreased strength BLE.  Improved perceived functional abilities with FOTO score of 47.37 was 37%.  Pt will continue to benefit from skilled intervention  to address goals unmet.   PT Next Visit Plan Recommend continuing OPPT 2x/ for 4 more weeks to address goals unmet.  F/U with MD apt tomorrow (08/21/2014)        Problem List There are no active problems to display for this patient.  [address] LPTA; CBIS [phone]  Aldona Lento 08/20/2014, 6:41 PM  [place] [address] [place], [place], [address] Phone: [phone]   Fax:  [phone]   Physical Therapy Progress Note  Dates of Reporting Period: 07/22/14 to 08/21/14  Objective Reports of Subjective Statement: see above   Objective Measurements: see above   Goal Update: see above   Plan: see above  Reason Skilled Services are Required: ongoing ankle stiffness, bilateral foot/ankle pain, gait impairment, reduced strength bilateral ankles, updates to HEP PRN   Deniece Ree PT, DPT [phone]

## 2014-08-21 ENCOUNTER — Ambulatory Visit (INDEPENDENT_AMBULATORY_CARE_PROVIDER_SITE_OTHER): Payer: BLUE CROSS/BLUE SHIELD | Admitting: Orthopedic Surgery

## 2014-08-21 VITALS — BP 111/84 | Ht 66.0 in | Wt 167.0 lb

## 2014-08-21 DIAGNOSIS — M722 Plantar fascial fibromatosis: Secondary | ICD-10-CM

## 2014-08-21 NOTE — Patient Instructions (Signed)
Continue Diclofenac Continue PT

## 2014-08-21 NOTE — Progress Notes (Signed)
Chief Complaint  Patient presents with  . Follow-up    6 week follow up bilateral plantar fasciitis    She returns with a history of bilateral plantar fasciitis we gave her bilateral heel cups and physical therapy she went to podiatrist he injected both sides the right In the left did not she presents for reevaluation and treatment  Review of systems negative for any new problems or related problems other than some pain in the sinus tarsus of the left ankle with swelling she thinks she may have twisted her foot  Exam shows bilateral symmetric arches tenderness over the left sinus tarsi region stable ankle drawer test no pain with inversion tenderness plantar aspect left heel neurovascular exam intact both ankles and feet  Continues with plantar fasciitis  Inject left heel  Continue therapy, he'll cup orthotics and diclofenac come back in 6 weeks  Inject left plantar fascia sterile technique was used to inject the left  fascia  Verbal consent was obtained and timeout was taken to confirm left heel injection.  Skin prepped with ethyl chloride and alcohol  Injected Depo-Medrol and lidocaine  Return 6 weeks

## 2014-08-26 ENCOUNTER — Telehealth (HOSPITAL_COMMUNITY): Payer: Self-pay

## 2014-08-26 ENCOUNTER — Ambulatory Visit (HOSPITAL_COMMUNITY): Payer: BLUE CROSS/BLUE SHIELD

## 2014-08-26 NOTE — Telephone Encounter (Signed)
She is leaving work sick and can not come in today, was placed  on call list for tomorrow or Friday

## 2014-08-28 ENCOUNTER — Ambulatory Visit (HOSPITAL_COMMUNITY): Payer: BLUE CROSS/BLUE SHIELD

## 2014-08-29 ENCOUNTER — Telehealth (HOSPITAL_COMMUNITY): Payer: Self-pay

## 2014-08-29 ENCOUNTER — Ambulatory Visit (HOSPITAL_COMMUNITY): Payer: BLUE CROSS/BLUE SHIELD

## 2014-08-29 NOTE — Telephone Encounter (Signed)
Pt called and stated she was unable to make it to session, transportation issues.  Session rescheduled for 08/29/2014  Becky Sax, LPTA; CBIS [phone]

## 2014-08-29 NOTE — Telephone Encounter (Signed)
No show, called and left message informing pt of missed apt today, included next apt date and time and contact information.   [address] LPTA; CBIS [phone]

## 2014-09-02 ENCOUNTER — Ambulatory Visit (HOSPITAL_COMMUNITY): Payer: BLUE CROSS/BLUE SHIELD

## 2014-09-02 DIAGNOSIS — M25671 Stiffness of right ankle, not elsewhere classified: Secondary | ICD-10-CM

## 2014-09-02 DIAGNOSIS — M722 Plantar fascial fibromatosis: Secondary | ICD-10-CM

## 2014-09-02 DIAGNOSIS — M25571 Pain in right ankle and joints of right foot: Secondary | ICD-10-CM

## 2014-09-02 DIAGNOSIS — M25672 Stiffness of left ankle, not elsewhere classified: Secondary | ICD-10-CM

## 2014-09-02 DIAGNOSIS — R269 Unspecified abnormalities of gait and mobility: Secondary | ICD-10-CM

## 2014-09-02 DIAGNOSIS — M25572 Pain in left ankle and joints of left foot: Secondary | ICD-10-CM

## 2014-09-02 NOTE — Therapy (Signed)
Jodi [place], [place], [place] Phone: [phone]   Fax:  [phone]  Physical Therapy Treatment  Patient Details  Name: Jodi Gray MRN: [id number] Date of Birth: 16-Sep-1970 Referring Provider:  Neale Burly, MD  Encounter Date: 09/02/2014      PT End of Session - 09/02/14 1743    Visit Number 8   Number of Visits 12   Date for PT Re-Evaluation 09/17/14   Authorization Type BCBS   Authorization Time Period 07/22/14 to 09/22/14   PT Start Time 1742   PT Stop Time 1818   PT Time Calculation (min) 36 min   Activity Tolerance Patient tolerated treatment well   Behavior During Therapy [facility] for tasks assessed/performed      No past medical history on file.  No past surgical history on file.  There were no vitals filed for this visit.  Visit Diagnosis:  Bilateral plantar fasciitis  Pain in joint, ankle and foot, left  Pain in joint, ankle and foot, right  Ankle and/or foot joint stiffness, left  Abnormality of gait  Ankle and/or foot joint stiffness, right      Subjective Assessment - 09/02/14 1738    Subjective Pt stated the pain continues on the Lt foot and heel, pain scale 5/10   Currently in Pain? Yes   Pain Score 5    Pain Location Foot   Pain Orientation Left   Pain Descriptors / Indicators Burning;Shooting            [facility] Adult PT Treatment/Exercise - 09/02/14 0001    Manual Therapy   Manual Therapy Joint mobilization;Soft tissue mobilization   Manual therapy comments focus to lateral heel and moving towards plantar fascia Lt foot in prone with stretch   Soft tissue mobilization pin point pain medial heel, STM to gastroc/soleus and plantar surface of foot   Ankle Exercises: Standing   Rocker Board 2 minutes;Limitations  R/L and A/P   Ankle Exercises: Stretches   Plantar Fascia Stretch 3 reps;30 seconds   Plantar Fascia Stretch Limitations bottom step   Gastroc Stretch 3  reps;30 seconds   Gastroc Stretch Limitations slant board   Ankle Exercises: Seated   BAPS Sitting;Level 3   BAPS Limitations CCW, CW, R/L, A/P             PT Short Term Goals - 08/20/14 1747    PT SHORT TERM GOAL #1   Title Patient will compliain of no more than 2/10 pain bilateral ankles and feet during all weight bearing activities    Baseline 08/20/2014 Average pain scale 6-8/10, reports pain reduced following stretching and therapy sessions   Status On-going   PT SHORT TERM GOAL #2   Title Patient will demonstrate bilateral ankle dorsiflexion of 20 degrees and increase bilateral inversion/eversion by at least 5 degrees    PT SHORT TERM GOAL #3   Title Patient will demonstrate the ability to bear weight equally down through lower extremities during gait with improved push off and heel strike, equal step lengths for unlimited distances    Baseline 08/20/2014: Able to demonstrate appropriate gait mechanics inconsistently   Status On-going   PT SHORT TERM GOAL #4   Title Patient will be independent in correctly and consistently performing appropriate HEP, to be updated PRN    Baseline 08/20/2014: Compliance HEP with pain   Status On-going           PT Long Term Goals - 08/20/14  [place] #1   Title Patient will experience 0/10 pain in bilateral feet with no lateral pain L ankle during all functional weight bearing tasks for unlimited amounts of time    Status Not Met   PT LONG TERM GOAL #2   Title Patient will be able to perform static stance activites for at least 60 minnutes with pain 0/10 and equal weight bearing    Status Not Met   PT LONG TERM GOAL #3   Title Patient will be able to ambulate over even and uneven surfaces with pain 0/10 for unlimited amounts of time, no fatigue, WNL gait mechanics    Status Not Met   PT LONG TERM GOAL #4   Title Patient will be able to maintain SLS on foam pad with no HHA for at least 30 seconds each LE with pain no  more than 1/10   Status Not Met   PT LONG TERM GOAL #5   Title Patient will report that she has been able to exercise on elliptical machine for at least 20 minutes a day, at least 3 days a week with pain no more than 1/10 during or after activity    Status Not Met   PT LONG TERM GOAL #6   Title Patient will demonstrate the ability to bear weight equally down through lower extremities during gait with improved push off and heel strike, equal step lengths for unlimited distances without shoes on   Baseline 08/20/2014: toe walking without shoes on for fear of pain   Status New               Plan - 09/02/14 1757    Clinical Impression Statement Pt late for apt today.  Session focus on improving ankle mobility and pain control.  Continued with stretches to improve flexibility on gastroc/soleus complex and plantar fascia region.  Pt continues to require cueing to improve form to progress ankle mobility with exercises and reduce compensation with knee and hip on BAPS board.  Ended session with manual anikle and form mobs and soft tissue mobilization techniques to reduce tightness and pain.  Noted pin point pain on medial aspect of knee, extra manual attention to spot.  Pt stated pain reduced to 2/10 at end of session.   PT Next Visit Plan Continue with current PT POC with stretches, ankle strengthening and pain control.          Problem List There are no active problems to display for this patient.  [address] LPTA; [place]odi Gray   Jodi Gray 09/02/2014, 6:22 PM  Jodi Gray [address] [place], [place], [address] Phone: [phone]   Fax:  [phone]

## 2014-09-04 ENCOUNTER — Ambulatory Visit (HOSPITAL_COMMUNITY): Payer: BLUE CROSS/BLUE SHIELD

## 2014-09-04 DIAGNOSIS — M722 Plantar fascial fibromatosis: Secondary | ICD-10-CM | POA: Diagnosis not present

## 2014-09-04 DIAGNOSIS — M25672 Stiffness of left ankle, not elsewhere classified: Secondary | ICD-10-CM

## 2014-09-04 DIAGNOSIS — R269 Unspecified abnormalities of gait and mobility: Secondary | ICD-10-CM

## 2014-09-04 DIAGNOSIS — M25671 Stiffness of right ankle, not elsewhere classified: Secondary | ICD-10-CM

## 2014-09-04 DIAGNOSIS — M25571 Pain in right ankle and joints of right foot: Secondary | ICD-10-CM

## 2014-09-04 DIAGNOSIS — M25572 Pain in left ankle and joints of left foot: Secondary | ICD-10-CM

## 2014-09-04 NOTE — Therapy (Signed)
[place], [place], [address] Phone: [phone]   Fax:  [phone]  Physical Therapy Treatment  Patient Details  Name: Jodi Gray MRN: [id number] Date of Birth: 03-Dec-1970 Referring Provider:  Neale Burly, MD  Encounter Date: 09/04/2014      PT End of Session - 09/04/14 1811    Visit Number 9   Number of Visits 12   Date for PT Re-Evaluation 09/17/14   Authorization Type BCBS   Authorization Time Period 07/22/14 to 09/22/14   PT Start Time 1736   PT Stop Time 1816   PT Time Calculation (min) 40 min   Activity Tolerance Patient tolerated treatment well   Behavior During Therapy [facility] for tasks assessed/performed      No past medical history on file.  No past surgical history on file.  There were no vitals filed for this visit.  Visit Diagnosis:  Bilateral plantar fasciitis  Pain in joint, ankle and foot, left  Pain in joint, ankle and foot, right  Ankle and/or foot joint stiffness, left  Abnormality of gait  Ankle and/or foot joint stiffness, right      Subjective Assessment - 09/04/14 1739    Subjective Pt stated Lt foot has been bothering her all day, feels like a tooth ache.   Currently in Pain? Yes   Pain Score 5    Pain Location Foot   Pain Orientation Left   Pain Descriptors / Indicators Aching;Burning          [facility] Adult PT Treatment/Exercise - 09/04/14 0001    Exercises   Exercises Ankle   Ankle Exercises: Stretches   Plantar Fascia Stretch 3 reps;30 seconds   Plantar Fascia Stretch Limitations bottom step   Gastroc Stretch 3 reps;30 seconds   Gastroc Stretch Limitations slant board   Ankle Exercises: Standing   Rocker Board 2 minutes;Limitations   Rocker Board Limitations R/L and A/P finger touching only   Ankle Exercises: Seated   Towel Crunch 1 rep   Marble Pickup 10 marbles             PT Short Term Goals - 08/20/14 1747    PT SHORT TERM GOAL #1   Title Patient will compliain of no more than 2/10 pain bilateral ankles and feet during all weight bearing activities    Baseline 08/20/2014 Average pain scale 6-8/10, reports pain reduced following stretching and therapy sessions   Status On-going   PT SHORT TERM GOAL #2   Title Patient will demonstrate bilateral ankle dorsiflexion of 20 degrees and increase bilateral inversion/eversion by at least 5 degrees    PT SHORT TERM GOAL #3   Title Patient will demonstrate the ability to bear weight equally down through lower extremities during gait with improved push off and heel strike, equal step lengths for unlimited distances    Baseline 08/20/2014: Able to demonstrate appropriate gait mechanics inconsistently   Status On-going   PT SHORT TERM GOAL #4   Title Patient will be independent in correctly and consistently performing appropriate HEP, to be updated PRN    Baseline 08/20/2014: Compliance HEP with pain   Status On-going           PT Long Term Goals - 08/20/14 1754    PT LONG TERM GOAL #1   Title Patient will experience 0/10 pain in bilateral feet with no lateral pain L ankle during all functional weight bearing tasks for unlimited amounts of time  Status Not Met   PT LONG TERM GOAL #2   Title Patient will be able to perform static stance activites for at least 60 minnutes with pain 0/10 and equal weight bearing    Status Not Met   PT LONG TERM GOAL #3   Title Patient will be able to ambulate over even and uneven surfaces with pain 0/10 for unlimited amounts of time, no fatigue, WNL gait mechanics    Status Not Met   PT LONG TERM GOAL #4   Title Patient will be able to maintain SLS on foam pad with no HHA for at least 30 seconds each LE with pain no more than 1/10   Status Not Met   PT LONG TERM GOAL #5   Title Patient will report that she has been able to exercise on elliptical machine for at least 20 minutes a day, at least 3 days a week with pain no more than 1/10 during or  after activity    Status Not Met   PT LONG TERM GOAL #6   Title Patient will demonstrate the ability to bear weight equally down through lower extremities during gait with improved push off and heel strike, equal step lengths for unlimited distances without shoes on   Baseline 08/20/2014: toe walking without shoes on for fear of pain   Status New               Plan - 09/04/14 1811    Clinical Impression Statement Added intrinsic musculature strengthening exercises this session.  Pt able to pick up marbles no difficutly, limited by fatigue with towel curls due to weakness.  Pt limited by pain and noted increased edema lateral aspect of ankle and gastroc/soleus complex.  Ended session with manual soft tissue mobilizations techniques for edema control and to reduce tightness, pt stated pain reduced a lot at end of session with improved gait mechanics noted.  Continues to have pin point pain on medial aspect of plantar surface of ankle.     PT Next Visit Plan Continue with current PT POC with stretches, ankle strengthening and pain control.          Problem List There are no active problems to display for this patient.  [address] LPTA; [place]  Aldona Lento 09/04/2014, 6:20 PM  [place] [address] [place], [place], [address] Phone: [phone]   Fax:  [phone]

## 2014-09-09 ENCOUNTER — Telehealth (HOSPITAL_COMMUNITY): Payer: Self-pay

## 2014-09-09 ENCOUNTER — Ambulatory Visit (HOSPITAL_COMMUNITY): Payer: BLUE CROSS/BLUE SHIELD

## 2014-09-09 DIAGNOSIS — M25671 Stiffness of right ankle, not elsewhere classified: Secondary | ICD-10-CM

## 2014-09-09 DIAGNOSIS — M722 Plantar fascial fibromatosis: Secondary | ICD-10-CM | POA: Diagnosis not present

## 2014-09-09 DIAGNOSIS — M25672 Stiffness of left ankle, not elsewhere classified: Secondary | ICD-10-CM

## 2014-09-09 DIAGNOSIS — R269 Unspecified abnormalities of gait and mobility: Secondary | ICD-10-CM

## 2014-09-09 DIAGNOSIS — M25571 Pain in right ankle and joints of right foot: Secondary | ICD-10-CM

## 2014-09-09 DIAGNOSIS — M25572 Pain in left ankle and joints of left foot: Secondary | ICD-10-CM

## 2014-09-09 NOTE — Therapy (Signed)
[place], [place], [address] Phone: [phone]   Fax:  [phone]  Physical Therapy Treatment  Patient Details  Name: Jodi Gray MRN: [id number] Date of Birth: 08-29-1970 Referring Provider:  Carole Civil, MD  Encounter Date: 09/09/2014      PT End of Session - 09/09/14 1750    Visit Number 10   Number of Visits 12   Date for PT Re-Evaluation 09/17/14   Authorization Type BCBS   Authorization Time Period 07/22/14 to 09/22/14   PT Start Time 1735   PT Stop Time 1825   PT Time Calculation (min) 50 min   Activity Tolerance Patient tolerated treatment well   Behavior During Therapy [facility] for tasks assessed/performed      No past medical history on file.  No past surgical history on file.  There were no vitals filed for this visit.  Visit Diagnosis:  Bilateral plantar fasciitis  Pain in joint, ankle and foot, left  Pain in joint, ankle and foot, right  Abnormality of gait  Ankle and/or foot joint stiffness, right  Ankle and/or foot joint stiffness, left      Subjective Assessment - 09/09/14 1735    Subjective Pt stated feet are feeling good today, hasn't done a lot of standing or walking today.   Currently in Pain? Yes   Pain Score 2    Pain Location Foot   Pain Orientation Left   Pain Descriptors / Indicators Discomfort                         [facility] Adult PT Treatment/Exercise - 09/09/14 0001    Exercises   Exercises Ankle   Manual Therapy   Manual Therapy Joint mobilization;Soft tissue mobilization   Manual therapy comments focus to lateral heel and moving towards plantar fascia Lt foot in prone with stretch   Soft tissue mobilization pin point pain medial heel, STM to gastroc/soleus and plantar surface of foot   Ankle Exercises: Stretches   Plantar Fascia Stretch 3 reps;30 seconds   Plantar Fascia Stretch Limitations bottom step   Gastroc Stretch 3 reps;30  seconds   Gastroc Stretch Limitations slant board   Ankle Exercises: Standing   Rocker Board 2 minutes;Limitations   Rocker Board Limitations R/L and A/P finger touching only   Heel Raises 15 reps;3 seconds   Toe Raise 15 reps;3 seconds   Ankle Exercises: Seated   Towel Crunch 1 rep   BAPS Sitting;Level 3   BAPS Limitations CCW, CW, R/L, A/P                  PT Short Term Goals - 08/20/14 1747    PT SHORT TERM GOAL #1   Title Patient will compliain of no more than 2/10 pain bilateral ankles and feet during all weight bearing activities    Baseline 08/20/2014 Average pain scale 6-8/10, reports pain reduced following stretching and therapy sessions   Status On-going   PT SHORT TERM GOAL #2   Title Patient will demonstrate bilateral ankle dorsiflexion of 20 degrees and increase bilateral inversion/eversion by at least 5 degrees    PT SHORT TERM GOAL #3   Title Patient will demonstrate the ability to bear weight equally down through lower extremities during gait with improved push off and heel strike, equal step lengths for unlimited distances    Baseline 08/20/2014: Able to demonstrate appropriate gait mechanics inconsistently   Status On-going  PT SHORT TERM GOAL #4   Title Patient will be independent in correctly and consistently performing appropriate HEP, to be updated PRN    Baseline 08/20/2014: Compliance HEP with pain   Status On-going           PT Long Term Goals - 08/20/14 1754    PT LONG TERM GOAL #1   Title Patient will experience 0/10 pain in bilateral feet with no lateral pain L ankle during all functional weight bearing tasks for unlimited amounts of time    Status Not Met   PT LONG TERM GOAL #2   Title Patient will be able to perform static stance activites for at least 60 minnutes with pain 0/10 and equal weight bearing    Status Not Met   PT LONG TERM GOAL #3   Title Patient will be able to ambulate over even and uneven surfaces with pain 0/10 for  unlimited amounts of time, no fatigue, WNL gait mechanics    Status Not Met   PT LONG TERM GOAL #4   Title Patient will be able to maintain SLS on foam pad with no HHA for at least 30 seconds each LE with pain no more than 1/10   Status Not Met   PT LONG TERM GOAL #5   Title Patient will report that she has been able to exercise on elliptical machine for at least 20 minutes a day, at least 3 days a week with pain no more than 1/10 during or after activity    Status Not Met   PT LONG TERM GOAL #6   Title Patient will demonstrate the ability to bear weight equally down through lower extremities during gait with improved push off and heel strike, equal step lengths for unlimited distances without shoes on   Baseline 08/20/2014: toe walking without shoes on for fear of pain   Status New               Plan - 09/09/14 1753    Clinical Impression Statement Began heel and toe raises for ankle strengthening with min cueing for form with new exercises, no reports of increased pain with new activity.  Pt improving intrinsic musculature with increase ease completeing towel crunch though does show musculature fatigue with activity.  Continued with stretches and manual to improve flexibilty with gastroc/soleus complex.  Pt reported pain free at end of session.  Discussion held with pt about proper supportive shoes wearing with work to reduce pain increase with standing and walking.     PT Next Visit Plan Continue with current PT POC with stretches, ankle strengthening and pain control.  Begin balance activities next session.          Problem List There are no active problems to display for this patient.  [address] LPTA; [place]  Aldona Lento 09/09/2014, 6:26 PM  [facility] [address] [place], [place], [address] Phone: [phone]   Fax:  [phone]

## 2014-09-11 ENCOUNTER — Ambulatory Visit (HOSPITAL_COMMUNITY): Payer: BLUE CROSS/BLUE SHIELD | Attending: Orthopedic Surgery | Admitting: Physical Therapy

## 2014-09-11 DIAGNOSIS — R269 Unspecified abnormalities of gait and mobility: Secondary | ICD-10-CM | POA: Insufficient documentation

## 2014-09-11 DIAGNOSIS — M25671 Stiffness of right ankle, not elsewhere classified: Secondary | ICD-10-CM | POA: Insufficient documentation

## 2014-09-11 DIAGNOSIS — M25572 Pain in left ankle and joints of left foot: Secondary | ICD-10-CM | POA: Diagnosis present

## 2014-09-11 DIAGNOSIS — M25672 Stiffness of left ankle, not elsewhere classified: Secondary | ICD-10-CM | POA: Diagnosis present

## 2014-09-11 DIAGNOSIS — M25571 Pain in right ankle and joints of right foot: Secondary | ICD-10-CM | POA: Diagnosis present

## 2014-09-11 DIAGNOSIS — M722 Plantar fascial fibromatosis: Secondary | ICD-10-CM | POA: Insufficient documentation

## 2014-09-11 NOTE — Therapy (Signed)
[place], [place], [address] Phone: [phone]   Fax:  [phone]  Physical Therapy Treatment  Patient Details  Name: Jodi Gray MRN: [id number] Date of Birth: Aug 14, 1970 Referring Provider:  Carole Civil, MD  Encounter Date: 09/11/2014      PT End of Session - 09/11/14 1741    Visit Number 11   Number of Visits 12   Date for PT Re-Evaluation 09/17/14   Authorization Type BCBS   Authorization Time Period 07/22/14 to 09/22/14   PT Start Time 1650   PT Stop Time 1730   PT Time Calculation (min) 40 min   Activity Tolerance Patient tolerated treatment well   Behavior During Therapy [facility] for tasks assessed/performed      No past medical history on file.  No past surgical history on file.  There were no vitals filed for this visit.  Visit Diagnosis:  Bilateral plantar fasciitis  Pain in joint, ankle and foot, left  Pain in joint, ankle and foot, right  Abnormality of gait  Ankle and/or foot joint stiffness, right  Ankle and/or foot joint stiffness, left      Subjective Assessment - 09/11/14 1747    Subjective Pt reports minimal pain today.   Currently in Pain? Yes   Pain Score 1    Pain Location Foot   Pain Orientation Left   Pain Descriptors / Indicators Discomfort                         [facility] Adult PT Treatment/Exercise - 09/11/14 1652    Manual Therapy   Manual Therapy Joint mobilization;Soft tissue mobilization   Manual therapy comments focus to lateral heel and moving towards plantar fascia Lt foot in prone with stretch   Soft tissue mobilization pin point pain medial heel, STM to gastroc/soleus and plantar surface of foot   Ankle Exercises: Stretches   Plantar Fascia Stretch 3 reps;30 seconds   Plantar Fascia Stretch Limitations bottom step   Gastroc Stretch 3 reps;30 seconds   Gastroc Stretch Limitations slant board   Other Stretch PROM with manual  techniques   Ankle Exercises: Standing   BAPS Level 2;Standing;10 reps;Limitations   Rocker Board 2 minutes;Limitations   Rocker Board Limitations R/L and A/P finger touching only   Heel Raises 15 reps;3 seconds   Toe Raise 15 reps;3 seconds                  PT Short Term Goals - 08/20/14 1747    PT SHORT TERM GOAL #1   Title Patient will compliain of no more than 2/10 pain bilateral ankles and feet during all weight bearing activities    Baseline 08/20/2014 Average pain scale 6-8/10, reports pain reduced following stretching and therapy sessions   Status On-going   PT SHORT TERM GOAL #2   Title Patient will demonstrate bilateral ankle dorsiflexion of 20 degrees and increase bilateral inversion/eversion by at least 5 degrees    PT SHORT TERM GOAL #3   Title Patient will demonstrate the ability to bear weight equally down through lower extremities during gait with improved push off and heel strike, equal step lengths for unlimited distances    Baseline 08/20/2014: Able to demonstrate appropriate gait mechanics inconsistently   Status On-going   PT SHORT TERM GOAL #4   Title Patient will be independent in correctly and consistently performing appropriate HEP, to be updated PRN  Baseline 08/20/2014: Compliance HEP with pain   Status On-going           PT Long Term Goals - 08/20/14 1754    PT LONG TERM GOAL #1   Title Patient will experience 0/10 pain in bilateral feet with no lateral pain L ankle during all functional weight bearing tasks for unlimited amounts of time    Status Not Met   PT LONG TERM GOAL #2   Title Patient will be able to perform static stance activites for at least 60 minnutes with pain 0/10 and equal weight bearing    Status Not Met   PT LONG TERM GOAL #3   Title Patient will be able to ambulate over even and uneven surfaces with pain 0/10 for unlimited amounts of time, no fatigue, WNL gait mechanics    Status Not Met   PT LONG TERM GOAL #4    Title Patient will be able to maintain SLS on foam pad with no HHA for at least 30 seconds each LE with pain no more than 1/10   Status Not Met   PT LONG TERM GOAL #5   Title Patient will report that she has been able to exercise on elliptical machine for at least 20 minutes a day, at least 3 days a week with pain no more than 1/10 during or after activity    Status Not Met   PT LONG TERM GOAL #6   Title Patient will demonstrate the ability to bear weight equally down through lower extremities during gait with improved push off and heel strike, equal step lengths for unlimited distances without shoes on   Baseline 08/20/2014: toe walking without shoes on for fear of pain   Status New               Plan - 09/11/14 1742    Clinical Impression Statement PT with 1/10 pain upon arrival.  States she is overall much better.  Completed established stretches and progressed BAPS to standing.  Pt able to complete with noted challenge and 1 rest break during activity.  Manual completed to  plantar fascia with good results.  No pain at end of session    PT Next Visit Plan Re-evaluate next session        Problem List There are no active problems to display for this patient.   Teena Irani, PTA/CLT [phone]  09/11/2014, 5:48 PM  [place] [address] [place], [place], [address] Phone: [phone]   Fax:  [phone]

## 2014-09-23 ENCOUNTER — Ambulatory Visit (HOSPITAL_COMMUNITY): Payer: BLUE CROSS/BLUE SHIELD | Admitting: Physical Therapy

## 2014-09-23 ENCOUNTER — Telehealth (HOSPITAL_COMMUNITY): Payer: Self-pay | Admitting: Physical Therapy

## 2014-09-23 NOTE — Telephone Encounter (Signed)
re-evaluate Pt has had a death in the family called pt we got disconnected tried to call her back l/m waiting for her to call us back to reschedule.

## 2014-09-23 NOTE — Telephone Encounter (Signed)
Patient a no show for last scheduled appointment. Called and left message advising patient that if she would like to schedule more PT to please call clinic. Patient to be DCed if she does not return phone call.   Nedra Hai PT, DPT [phone]

## 2014-09-23 NOTE — Telephone Encounter (Signed)
Patient returned PT's phone call, very apologetic and reports that she had a death in the family and was at a funeral today, just lost track of this appointment. Would like to have a re-assess done, as this is what was scheduled to happen during today's session. PT had front desk staff call patient to arrange 1 follow up appointment.   Nedra Hai PT, DPT [phone]

## 2014-10-02 ENCOUNTER — Ambulatory Visit (INDEPENDENT_AMBULATORY_CARE_PROVIDER_SITE_OTHER): Payer: BLUE CROSS/BLUE SHIELD | Admitting: Orthopedic Surgery

## 2014-10-02 VITALS — BP 141/91 | Ht 66.0 in | Wt 167.0 lb

## 2014-10-02 DIAGNOSIS — G5752 Tarsal tunnel syndrome, left lower limb: Secondary | ICD-10-CM | POA: Diagnosis not present

## 2014-10-02 DIAGNOSIS — M722 Plantar fascial fibromatosis: Secondary | ICD-10-CM

## 2014-10-02 DIAGNOSIS — M25572 Pain in left ankle and joints of left foot: Secondary | ICD-10-CM

## 2014-10-02 NOTE — Progress Notes (Signed)
Chief Complaint  Patient presents with  . Follow-up    6 week follow up bilatera plantar fasc    44 years old or fasciitis bilateral status post physical therapy, heel cups and injections  Last injection left. Patient doing well with stretching but having left sinus tarsi pain stable drawer test no inversion pain but tenderness in the sinus tarsi  Review of systems otherwise normal  No past medical history   BP 141/91 mmHg  Ht  (1.676 m)  Wt 167 lb (75.751 kg)  BMI 26.97 kg/m2   Appearance normal oriented 3 mood normal gait normal skin intact over the left foot and ankle neurovascular exam intact. Sinus tarsi tenderness sinus tarsi range of motion normal mild pes planus ankle staples motor exam normal  Injection left side sinus tarsi  Verbal consent timeout completed confirm site. Depo-Medrol 40 mg Lidocaine 1% Action performed without complication. Patient will follow-up as needed

## 2014-11-19 NOTE — Telephone Encounter (Signed)
Called concerning apt date and time  Shellie Rogoff, LPTA; CBIS [phone]  

## 2015-04-07 ENCOUNTER — Encounter (HOSPITAL_COMMUNITY): Payer: Self-pay | Admitting: Emergency Medicine

## 2015-04-07 ENCOUNTER — Emergency Department (HOSPITAL_COMMUNITY): Payer: BLUE CROSS/BLUE SHIELD

## 2015-04-07 ENCOUNTER — Emergency Department (HOSPITAL_COMMUNITY)
Admission: EM | Admit: 2015-04-07 | Discharge: 2015-04-07 | Disposition: A | Discharge status: Home / Self Care | Payer: BLUE CROSS/BLUE SHIELD | Attending: Emergency Medicine | Admitting: Emergency Medicine

## 2015-04-07 DIAGNOSIS — S8992XA Unspecified injury of left lower leg, initial encounter: Secondary | ICD-10-CM | POA: Diagnosis present

## 2015-04-07 DIAGNOSIS — Z7722 Contact with and (suspected) exposure to environmental tobacco smoke (acute) (chronic): Secondary | ICD-10-CM | POA: Insufficient documentation

## 2015-04-07 DIAGNOSIS — Y939 Activity, unspecified: Secondary | ICD-10-CM | POA: Insufficient documentation

## 2015-04-07 DIAGNOSIS — Y999 Unspecified external cause status: Secondary | ICD-10-CM | POA: Insufficient documentation

## 2015-04-07 DIAGNOSIS — W1782XA Fall from (out of) grocery cart, initial encounter: Secondary | ICD-10-CM | POA: Insufficient documentation

## 2015-04-07 DIAGNOSIS — M1990 Unspecified osteoarthritis, unspecified site: Secondary | ICD-10-CM | POA: Insufficient documentation

## 2015-04-07 DIAGNOSIS — Y92512 Supermarket, store or market as the place of occurrence of the external cause: Secondary | ICD-10-CM | POA: Diagnosis not present

## 2015-04-07 HISTORY — DX: Unspecified osteoarthritis, unspecified site: M19.90

## 2015-04-07 MED ORDER — HYDROCODONE-ACETAMINOPHEN 5-325 MG PO TABS: 1.0000 | ORAL_TABLET | ORAL | Status: DC | PRN

## 2015-04-07 MED ORDER — DICLOFENAC SODIUM 50 MG PO TBEC: 50.0000 mg | DELAYED_RELEASE_TABLET | Freq: Two times a day (BID) | ORAL | Status: DC

## 2015-04-07 NOTE — ED Provider Notes (Signed)
CSN: [id number]     Arrival date & time 04/07/15  1558 History   First MD Initiated Contact with Patient 04/07/15 1816     Chief Complaint  Patient presents with  . Knee Injury    left     (Consider location/radiation/quality/duration/timing/severity/associated sxs/prior Treatment) Patient is a 45 y.o. female presenting with knee pain. The history is provided by the patient.  Knee Pain Location:  Knee Injury: yes   Knee location:  L knee Pain details:    Quality:  Throbbing   Radiates to:  Does not radiate   Severity:  Moderate   Timing:  Constant   Progression:  Worsening Chronicity:  New Dislocation: no   Foreign body present:  No foreign bodies Prior injury to area:  Yes Relieved by:  Nothing Worsened by:  Bearing weight Associated symptoms: swelling    Jodi Gray is a 45 y.o. female who presents to the ED with left knee pain after she fell in the [employer] store. She reports that they are doing remodeling and had an area of the floor that was uneven and caused her to almost fall. She caught herself but her knee twisted. She has had surgery on the left knee. She had increasing pain after the injury and decided to come in.   Past Medical History  Diagnosis Date  . Arthritis    Past Surgical History  Procedure Laterality Date  . Knee surgery      right and left.  . Tubal ligation     No family history on file. Social History  Substance Use Topics  . Smoking status: Passive Smoke Exposure - Never Smoker  . Smokeless tobacco: None  . Alcohol Use: Yes     Comment: occasionally   OB History    No data available     Review of Systems Negative except as stated in PMH and HPI   Allergies  Review of patient's allergies indicates no known allergies.  Home Medications   Prior to Admission medications   Medication Sig Start Date End Date Taking? Authorizing Provider  diclofenac (VOLTAREN) 50 MG EC tablet Take 1 tablet (50 mg total) by mouth 2 (two)  times daily. 04/07/15   Hope Orlene OchM Neese, NP  DICLOFENAC PO Take by mouth.    Historical Provider, MD  Gabapentin (NEURONTIN PO) Take by mouth.    Historical Provider, MD  HYDROcodone-acetaminophen (NORCO/VICODIN) 5-325 MG tablet Take 1 tablet by mouth every 4 (four) hours as needed. 04/07/15   Hope Orlene OchM Neese, NP  topiramate (TOPAMAX) 50 MG tablet     Historical Provider, MD   BP 147/96 mmHg  Pulse 84  Temp(Src) 98.3 F (36.8 C) (Oral)  Resp 16  Ht 5\' 6"  (1.676 m)  Wt 77.111 kg  BMI 27.45 kg/m2  SpO2 100%  LMP 03/28/2015 Physical Exam  Constitutional: She is oriented to person, place, and time. She appears well-developed and well-nourished. No distress.  HENT:  Head: Normocephalic.  Eyes: EOM are normal.  Neck: Neck supple.  Cardiovascular: Normal rate.   Pulmonary/Chest: Effort normal.  Musculoskeletal:       Left knee: She exhibits swelling. She exhibits normal range of motion, no ecchymosis, no deformity, no laceration, no erythema and normal alignment. Tenderness found. Medial joint line tenderness noted.  Pedal pulses 2+, adequate circulation, good touch sensation. Increased pain with flexion of the knee.   Neurological: She is alert and oriented to person, place, and time. No cranial nerve deficit.  Skin:  Skin is warm and dry.  Psychiatric: She has a normal mood and affect. Her behavior is normal.  Nursing note and vitals reviewed.   ED Course  Procedures (including critical care time) Labs Review Labs Reviewed - No data to display  Imaging Review Dg Knee Complete 4 Views Left  04/07/2015  CLINICAL DATA:  Fall with left knee pop.  Initial encounter. EXAM: LEFT KNEE - COMPLETE 4+ VIEW COMPARISON:  None. FINDINGS: Patellofemoral compartment arthroplasty. Patella appears high in the frontal and oblique projections but is normally positioned when seen laterally. No evidence of acute fracture. No signs of hardware loosening. Medial and lateral osteoarthritis. IMPRESSION: 1. No acute  osseous finding. 2. Osteoarthritis with patellofemoral arthroplasty. Electronically Signed   By: Marnee Spring M.D.   On: 04/07/2015 17:12    MDM  45 y.o. female with left knee pain s/p injury stable for d/c without focal neuro deficits and no acute findings on x-ray. Knee immobilizer, crutches, ice, elevation and pain management. She will f/u with ortho or return here as needed Final diagnoses:  Knee injury, left, initial encounter      [facility], NP 04/09/15 1610  Glynn Octave, MD 04/09/15 1323

## 2015-04-07 NOTE — ED Notes (Signed)
Injury to left knee in [employer] [place] and injured Knee, rates pain 7/10.

## 2015-04-08 ENCOUNTER — Ambulatory Visit (INDEPENDENT_AMBULATORY_CARE_PROVIDER_SITE_OTHER): Payer: BLUE CROSS/BLUE SHIELD | Admitting: Orthopedic Surgery

## 2015-04-08 ENCOUNTER — Encounter: Payer: Self-pay | Admitting: Orthopedic Surgery

## 2015-04-08 VITALS — Ht 66.0 in | Wt 170.0 lb

## 2015-04-08 DIAGNOSIS — S8992XA Unspecified injury of left lower leg, initial encounter: Secondary | ICD-10-CM | POA: Diagnosis not present

## 2015-04-08 DIAGNOSIS — S83242A Other tear of medial meniscus, current injury, left knee, initial encounter: Secondary | ICD-10-CM

## 2015-04-08 NOTE — Patient Instructions (Signed)
WE WILL SCHEDULE MRI FOR YOU AND CALL YOU WITH APPOINTMENT  

## 2015-04-08 NOTE — Progress Notes (Signed)
Patient ID: Jodi Gray, female   DOB: February 07, 1970, 45 y.o.   MRN: [id number]  Chief Complaint  Patient presents with  . Knee Pain    left knee pain, twisting injury, DOI 04/07/15    HPI Jodi Gray is a 45 y.o. female.  45 year old female with a history of a patellofemoral arthroplasty isolated 10 2005 presents with a new injury twisting her left knee at [employer] in [place]  Date of injury was 04/07/2015  She presents with 7 out of 10 pain associated with swelling and stiffness in the left knee. Her x-ray shows a stable patellofemoral arthroplasty. She was placed in a knee immobilizer and given crutches.    Review of Systems Review of Systems  Constitutional: Negative for chills.  Musculoskeletal: Positive for gait problem.  All other systems reviewed and are negative.   Past Medical History  Diagnosis Date  . Arthritis     Past Surgical History  Procedure Laterality Date  . Knee surgery      right and left.  . Tubal ligation      No family history on file.  Social History Social History  Substance Use Topics  . Smoking status: Passive Smoke Exposure - Never Smoker  . Smokeless tobacco: Not on file  . Alcohol Use: Yes     Comment: occasionally    No Known Allergies  Current Outpatient Prescriptions  Medication Sig Dispense Refill  . diclofenac (VOLTAREN) 50 MG EC tablet Take 1 tablet (50 mg total) by mouth 2 (two) times daily. 15 tablet 0  . DICLOFENAC PO Take by mouth.    . Gabapentin (NEURONTIN PO) Take by mouth.    Marland Kitchen. HYDROcodone-acetaminophen (NORCO/VICODIN) 5-325 MG tablet Take 1 tablet by mouth every 4 (four) hours as needed. 20 tablet 0  . topiramate (TOPAMAX) 50 MG tablet      No current facility-administered medications for this visit.       Physical Exam Physical Exam  Constitutional: She is oriented to person, place, and time. She appears well-developed and well-nourished.  HENT:  Head: Normocephalic and atraumatic.  Neck: Neck  supple. No tracheal deviation present.  Cardiovascular: Intact distal pulses.   Musculoskeletal:       Right shoulder: Normal.       Left shoulder: Normal.  She is requiring a knee brace and a crutch in the opposite arm for ambulation weightbearing as tolerated with pain  She has a midline incision no complications related to that. Normal sensation around the knee pain in the medial joint line and medial parapatellar structures with a stable patella knee flexion is approximate 45 stable in the anteroposterior plane collateral ligaments are stable.  Neurological: She is alert and oriented to person, place, and time. She displays normal reflexes. No cranial nerve deficit. She exhibits normal muscle tone. Coordination normal.  Skin: Skin is warm and dry.  Psychiatric: She has a normal mood and affect. Her behavior is normal. Judgment and thought content normal.   Height 5\' 6"  (1.676 m), weight 170 lb (77.111 kg), last menstrual period 03/28/2015.  The opposite knee reveals no swelling or effusion, full range of motion, normal muscle tone, anterior and posterior drawer test stable neurovascular exam intact  Data Reviewed Patellofemoral arthroplasty noted. Significant changes noted around the tibial tubercle from other prior surgeries. No acute fracture. Mild tibiofemoral arthrosis as well.   Assessment   torn medial meniscus  left knee   Plan MRI left knee;

## 2015-04-09 ENCOUNTER — Telehealth: Payer: Self-pay | Admitting: Orthopedic Surgery

## 2015-04-09 ENCOUNTER — Encounter: Payer: Self-pay | Admitting: Orthopedic Surgery

## 2015-04-09 NOTE — Telephone Encounter (Signed)
Approved.  

## 2015-04-09 NOTE — Telephone Encounter (Signed)
Patient called, requests to return to work this Monday, 04/13/15 Vs 04/22/15; if approved, will need updated work note.  Please advise.  Ph [phone]

## 2015-04-10 NOTE — Telephone Encounter (Signed)
04/09/15 Note issued accordingly.  Contacted patient.*  *Note picked up 04/10/15.

## 2015-04-20 ENCOUNTER — Ambulatory Visit (HOSPITAL_COMMUNITY)
Admission: RE | Admit: 2015-04-20 | Discharge: 2015-04-20 | Disposition: A | Discharge status: Home / Self Care | Payer: BLUE CROSS/BLUE SHIELD | Source: Ambulatory Visit | Attending: Orthopedic Surgery | Admitting: Orthopedic Surgery

## 2015-04-20 DIAGNOSIS — X501XXA Overexertion from prolonged static or awkward postures, initial encounter: Secondary | ICD-10-CM | POA: Diagnosis not present

## 2015-04-20 DIAGNOSIS — M25562 Pain in left knee: Secondary | ICD-10-CM | POA: Diagnosis not present

## 2015-04-20 DIAGNOSIS — S8992XA Unspecified injury of left lower leg, initial encounter: Secondary | ICD-10-CM

## 2015-04-20 DIAGNOSIS — S8982XA Other specified injuries of left lower leg, initial encounter: Secondary | ICD-10-CM | POA: Insufficient documentation

## 2015-04-20 DIAGNOSIS — Y939 Activity, unspecified: Secondary | ICD-10-CM | POA: Insufficient documentation

## 2015-04-29 ENCOUNTER — Ambulatory Visit (INDEPENDENT_AMBULATORY_CARE_PROVIDER_SITE_OTHER): Payer: BLUE CROSS/BLUE SHIELD | Admitting: Orthopedic Surgery

## 2015-04-29 ENCOUNTER — Encounter: Payer: Self-pay | Admitting: Orthopedic Surgery

## 2015-04-29 VITALS — BP 167/106 | Ht 66.0 in | Wt 172.0 lb

## 2015-04-29 DIAGNOSIS — S8992XD Unspecified injury of left lower leg, subsequent encounter: Secondary | ICD-10-CM | POA: Diagnosis not present

## 2015-04-29 DIAGNOSIS — S83242D Other tear of medial meniscus, current injury, left knee, subsequent encounter: Secondary | ICD-10-CM | POA: Diagnosis not present

## 2015-04-29 NOTE — Patient Instructions (Signed)
Generic Knee Exercises EXERCISES RANGE OF MOTION (ROM) AND STRETCHING EXERCISES These exercises may help you when beginning to rehabilitate your injury. Your symptoms may resolve with or without further involvement from your physician, physical therapist, or athletic trainer. While completing these exercises, remember:   Restoring tissue flexibility helps normal motion to return to the joints. This allows healthier, less painful movement and activity.  An effective stretch should be held for at least 30 seconds.  A stretch should never be painful. You should only feel a gentle lengthening or release in the stretched tissue. STRETCH - Knee Extension, Prone  Lie on your stomach on a firm surface, such as a bed or countertop. Place your right / left knee and leg just beyond the edge of the surface. You may wish to place a towel under the far end of your right / left thigh for comfort.  Relax your leg muscles and allow gravity to straighten your knee. Your clinician may advise you to add an ankle weight if more resistance is helpful for you.  You should feel a stretch in the back of your right / left knee. Hold this position for __________ seconds. Repeat __________ times. Complete this stretch __________ times per day. * Your physician, physical therapist, or athletic trainer may ask you to add ankle weight to enhance your stretch.  RANGE OF MOTION - Knee Flexion, Active  Lie on your back with both knees straight. (If this causes back discomfort, bend your opposite knee, placing your foot flat on the floor.)  Slowly slide your heel back toward your buttocks until you feel a gentle stretch in the front of your knee or thigh.  Hold for __________ seconds. Slowly slide your heel back to the starting position. Repeat __________ times. Complete this exercise __________ times per day.  STRETCH - Quadriceps, Prone   Lie on your stomach on a firm surface, such as a bed or padded floor.  Bend your  right / left knee and grasp your ankle. If you are unable to reach your ankle or pant leg, use a belt around your foot to lengthen your reach.  Gently pull your heel toward your buttocks. Your knee should not slide out to the side. You should feel a stretch in the front of your thigh and/or knee.  Hold this position for __________ seconds. Repeat __________ times. Complete this stretch __________ times per day.  STRETCH - Hamstrings, Supine   Lie on your back. Loop a belt or towel over the ball of your right / left foot.  Straighten your right / left knee and slowly pull on the belt to raise your leg. Do not allow the right / left knee to bend. Keep your opposite leg flat on the floor.  Raise the leg until you feel a gentle stretch behind your right / left knee or thigh. Hold this position for __________ seconds. Repeat __________ times. Complete this stretch __________ times per day.  STRENGTHENING EXERCISES These exercises may help you when beginning to rehabilitate your injury. They may resolve your symptoms with or without further involvement from your physician, physical therapist, or athletic trainer. While completing these exercises, remember:   Muscles can gain both the endurance and the strength needed for everyday activities through controlled exercises.  Complete these exercises as instructed by your physician, physical therapist, or athletic trainer. Progress the resistance and repetitions only as guided.  You may experience muscle soreness or fatigue, but the pain or discomfort you are trying to   eliminate should never worsen during these exercises. If this pain does worsen, stop and make certain you are following the directions exactly. If the pain is still present after adjustments, discontinue the exercise until you can discuss the trouble with your clinician. STRENGTH - Quadriceps, Isometrics  Lie on your back with your right / left leg extended and your opposite knee  bent.  Gradually tense the muscles in the front of your right / left thigh. You should see either your knee cap slide up toward your hip or increased dimpling just above the knee. This motion will push the back of the knee down toward the floor/mat/bed on which you are lying.  Hold the muscle as tight as you can without increasing your pain for __________ seconds.  Relax the muscles slowly and completely in between each repetition. Repeat __________ times. Complete this exercise __________ times per day.  STRENGTH - Quadriceps, Short Arcs   Lie on your back. Place a __________ inch towel roll under your knee so that the knee slightly bends.  Raise only your lower leg by tightening the muscles in the front of your thigh. Do not allow your thigh to rise.  Hold this position for __________ seconds. Repeat __________ times. Complete this exercise __________ times per day.  OPTIONAL ANKLE WEIGHTS: Begin with ____________________, but DO NOT exceed ____________________. Increase in 1 pound/0.5 kilogram increments.  STRENGTH - Quadriceps, Straight Leg Raises  Quality counts! Watch for signs that the quadriceps muscle is working to insure you are strengthening the correct muscles and not "cheating" by substituting with healthier muscles.  Lay on your back with your right / left leg extended and your opposite knee bent.  Tense the muscles in the front of your right / left thigh. You should see either your knee cap slide up or increased dimpling just above the knee. Your thigh may even quiver.  Tighten these muscles even more and raise your leg 4 to 6 inches off the floor. Hold for __________ seconds.  Keeping these muscles tense, lower your leg.  Relax the muscles slowly and completely in between each repetition. Repeat __________ times. Complete this exercise __________ times per day.  STRENGTH - Hamstring, Curls  Lay on your stomach with your legs extended. (If you lay on a bed, your feet  may hang over the edge.)  Tighten the muscles in the back of your thigh to bend your right / left knee up to 90 degrees. Keep your hips flat on the bed/floor.  Hold this position for __________ seconds.  Slowly lower your leg back to the starting position. Repeat __________ times. Complete this exercise __________ times per day.  OPTIONAL ANKLE WEIGHTS: Begin with ____________________, but DO NOT exceed ____________________. Increase in 1 pound/0.5 kilogram increments.  STRENGTH - Quadriceps, Squats  Stand in a door frame so that your feet and knees are in line with the frame.  Use your hands for balance, not support, on the frame.  Slowly lower your weight, bending at the hips and knees. Keep your lower legs upright so that they are parallel with the door frame. Squat only within the range that does not increase your knee pain. Never let your hips drop below your knees.  Slowly return upright, pushing with your legs, not pulling with your hands. Repeat __________ times. Complete this exercise __________ times per day.  STRENGTH - Quadriceps, Wall Slides  Follow guidelines for form closely. Increased knee pain often results from poorly placed feet or knees.    Lean against a smooth wall or door and walk your feet out 18-24 inches. Place your feet hip-width apart.  Slowly slide down the wall or door until your knees bend __________ degrees.* Keep your knees over your heels, not your toes, and in line with your hips, not falling to either side.  Hold for __________ seconds. Stand up to rest for __________ seconds in between each repetition. Repeat __________ times. Complete this exercise __________ times per day. * Your physician, physical therapist, or athletic trainer will alter this angle based on your symptoms and progress.   This information is not intended to replace advice given to you by your health care provider. Make sure you discuss any questions you have with your health care  provider.   Document Released: 11/10/2004 Document Revised: 01/17/2014 Document Reviewed: 04/10/2008 Elsevier Interactive Patient Education 2016 [employer]  

## 2015-04-30 NOTE — Progress Notes (Signed)
Patient ID: Jodi Gray, female   DOB: 07/27/1970, 45 y.o.   MRN: [id number]  MRI FOLLOW UP  Chief Complaint  Patient presents with  . Results    REVIEW MRI LEFT KNEE    HPI Jodi Gray is a 45 y.o. female.   She twisted her injury on March 28. She had a previous patellofemoral arthroplasty in 2005. The new injury occurred [place] [employer] [place]. She had 7 out of pain with swelling stiffness in her left knee she was given a knee immobilizer and crutches. She had significant symptoms enough to warrant MRI of the knee MRI OF THE right knee showed some artifact and some degeneration of the anterior horn where she is most tender. However the artifact prevents full evaluation of the knee joint.    Review of Systems  Musculoskeletal:       Musculoskeletal left knee pain wishes to get the brace off no fever or chills    Physical Exam  Constitutional: She is oriented to person, place, and time. She appears well-developed and well-nourished. No distress.  Cardiovascular: Normal rate and intact distal pulses.   Neurological: She is alert and oriented to person, place, and time. She has normal reflexes. She exhibits normal muscle tone. Coordination normal.  Skin: Skin is warm and dry. No rash noted. She is not diaphoretic. No erythema. No pallor.  Psychiatric: She has a normal mood and affect. Her behavior is normal. Judgment and thought content normal.    Reexamination of the knee reveals tenderness right at the anterior medial joint line. She has crepitance in that area. She's able to move her knee now much better approximate 100  Data  Independent image interpretation the MRI showed artifact intraosseous ganglion anteromedial tibia degenerative tear anteromedial medial meniscus    The report was read as follows     IMPRESSION: 1. Postsurgical changes from previous patellofemoral arthroplasty and patellar realignment procedure. 2. Suspected degenerative  tear of the anterior horn and body of the medial meniscus. 3. The lateral meniscus, cruciate and collateral ligaments appear intact. 4. 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     Electronically Signed   By: Carey BullocksWilliam  Veazey M.D.   On: 04/21/2015 09:29   Remove the immobilizer start home exercises and follow-up with me for reexamination patient is okay with the plan of possible arthroscopy if she does not get better

## 2015-05-04 NOTE — Therapy (Signed)
[facility] [place], [place], [address] Phone: [phone]   Fax:  [phone]  Patient Details  Name: XIANA CARNS MRN: [id number] Date of Birth: 1970/08/03 Referring Provider:  Carole Civil, MD  Encounter Date: 05/04/2015  PHYSICAL THERAPY DISCHARGE SUMMARY  Visits from Start of Care: 11  Current functional level related to goals / functional outcomes: Has not returned since last skilled session    Remaining deficits: Unable to assess    Education / Equipment: None  Plan: Patient agrees to discharge.  Patient goals were not met. Patient is being discharged due to not returning since the last visit.  ?????       Deniece Ree PT, DPT [facility] [address] [place], [place], [address] Phone: [phone]   Fax:  [phone]

## 2015-05-05 ENCOUNTER — Encounter: Payer: Self-pay | Admitting: Orthopedic Surgery

## 2015-05-20 ENCOUNTER — Ambulatory Visit (INDEPENDENT_AMBULATORY_CARE_PROVIDER_SITE_OTHER): Payer: PRIVATE HEALTH INSURANCE | Admitting: Orthopedic Surgery

## 2015-05-20 VITALS — BP 126/89 | HR 83 | Ht 66.0 in | Wt 174.0 lb

## 2015-05-20 DIAGNOSIS — S83242D Other tear of medial meniscus, current injury, left knee, subsequent encounter: Secondary | ICD-10-CM | POA: Diagnosis not present

## 2015-05-20 NOTE — Patient Instructions (Addendum)
Surgery 06/11/15  You have decided to proceed with operative arthroscopy of the knee. You have decided not to continue with nonoperative measures such as but not limited to oral medication, weight loss, activity modification, physical therapy, bracing, or injection.  We will perform operative arthroscopy of the knee. Some of the risks associated with arthroscopic surgery of the knee include but are not limited to Bleeding Infection Swelling Stiffness Blood clot Pain  If you're not comfortable with these risks and would like to continue with nonoperative treatment please let Dr. Romeo Apple know prior to your surgery. Knee Arthroscopy Knee arthroscopy is a surgical procedure that is used to examine the inside of your knee joint and repair any damage. The surgeon puts a small, lighted instrument with a camera on the tip (arthroscope) through a small incision in your knee. The camera sends pictures to a monitor in the operating room. Your surgeon uses those pictures to guide the surgical instruments through other incisions to the area of damage. Knee arthroscopy can be used to treat many types of knee problems. It may be used:  To repair a torn ligament.  To repair or remove damaged tissue.  To remove a fluid-filled sac (cyst) from your knee. LET [facility] CARE PROVIDER KNOW ABOUT:  Any allergies you have.  All medicines you are taking, including vitamins, herbs, eye drops, creams, and over-the-counter medicines.  Previous problems you or members of your family have had with the use of anesthetics.  Any blood disorders you have.  Previous surgeries you have had.  Any medical conditions you may have. RISKS AND COMPLICATIONS Generally, this is a safe procedure. However, problems may occur, including:  Infection.  Bleeding.  Damage to blood vessels, nerves, or structures of your knee.  A blood clot that forms in your leg and travels to your lung.  Failure to relieve  symptoms. BEFORE THE PROCEDURE  Ask your health care provider about:  Changing or stopping your regular medicines. This is especially important if you are taking diabetes medicines or blood thinners.  Taking medicines such as aspirin and ibuprofen. These medicines can thin your blood. Do not take these medicines before your procedure if your health care provider instructs you not to.  Follow your health care provider's instructions about eating or drinking restrictions.  Plan to have someone take you home after the procedure.  If you go home right after the procedure, plan to have someone with you for 24 hours.  Do not drink alcohol unless your health care provider says that you can.  Do not use any tobacco products, including cigarettes, chewing tobacco, or electronic cigarettes unless your health care provider says that you can. If you need help quitting, ask your health care provider.  You may have a physical exam. PROCEDURE  An IV tube will be inserted into one of your veins.  You will be given one or more of the following:  A medicine that helps you relax (sedative).  A medicine that numbs the area (local anesthetic).  A medicine that makes you fall asleep (general anesthetic).  A medicine that is injected into your spine that numbs the area below and slightly above the injection site (spinal anesthetic).  A medicine that is injected into an area of your body that numbs everything below the injection site (regional anesthetic).  A cuff may be placed around your upper leg to slow bleeding during the procedure.  The surgeon will make a small number of incisions around your knee.  Your knee joint will be flushed and filled with a germ-free (sterile) solution.  The arthroscope will be passed through an incision into your knee joint.  More instruments will be passed through other incisions to repair your knee as needed.  The fluid will be removed from your knee.  The  incisions will be closed with adhesive strips or stitches (sutures).  A bandage (dressing) will be placed over your knee. The procedure may vary among health care providers and hospitals. AFTER THE PROCEDURE  Your blood pressure, heart rate, breathing rate and blood oxygen level will be monitored often until the medicines you were given have worn off.  You may be given medicine for pain.  You may get crutches to help you walk without using your knee to support your body weight.  You may have to wear compression stockings. These stocking help to prevent blood clots and reduce swelling in your legs.   This information is not intended to replace advice given to you by your health care provider. Make sure you discuss any questions you have with your health care provider.   Document Released: 12/25/1999 Document Revised: 05/13/2014 Document Reviewed: 12/23/2013 Elsevier Interactive Patient Education [employer] Elsevier Inc.

## 2015-05-20 NOTE — Progress Notes (Signed)
Chief Complaint  Patient presents with  . Follow-up    left knee    Review of systems currently having no fever or chills   Past Medical History  Diagnosis Date  . 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 Established problem not improved  More than 50% of this visit was involved in preoperative counseling and discussion of surgery this was a face-to-face counseling session  In summation the counseling session was related to the reevaluation of the patient's symptoms, MRI, x-rays and decision for surgery and its details risks and postoperative course.

## 2015-06-01 NOTE — Patient Instructions (Signed)
Tora KindredRhonda T Portell  06/01/2015     @PREFPERIOPPHARMACY @   Your procedure is scheduled on 06/11/2015.  [facility]  Call this number if you have problems the morning of surgery:  [phone]   Remember:  Do not eat food or drink liquids after midnight.  Take these medicines the morning of surgery with A SIP OF WATER Voltaren, Hydrocodone if needed   Do not wear jewelry, make-up or nail polish.  Do not wear lotions, powders, or perfumes.  You may wear deodorant.  Do not shave 48 hours prior to surgery.  Men may shave face and neck.  Do not bring valuables to the hospital.  [facility] belongings or [facility]uables.  Contacts, dentures or bridgework may not be worn into surgery.  Leave your suitcase in the car.  After surgery it may be brought to your room.  For patients admitted to the hospital, discharge time will be determined by your treatment team.  Patients discharged the day of surgery will not be allowed to drive home.    Please read over the following fact sheets that you were given. Surgical Site Infection Prevention and Anesthesia Post-op Instructions     PATIENT INSTRUCTIONS POST-ANESTHESIA  IMMEDIATELY FOLLOWING SURGERY:  Do not drive or operate machinery for the first twenty four hours after surgery.  Do not make any important decisions for twenty four hours after surgery or while taking narcotic pain medications or sedatives.  If you develop intractable nausea and vomiting or a severe headache please notify your doctor immediately.  FOLLOW-UP:  Please make an appointment with your surgeon as instructed. You do not need to follow up with anesthesia unless specifically instructed to do so.  WOUND CARE INSTRUCTIONS (if applicable):  Keep a dry clean dressing on the anesthesia/puncture wound site if there is drainage.  Once the wound has quit draining you may leave it open to air.  Generally you should leave the bandage intact  for twenty four hours unless there is drainage.  If the epidural site drains for more than 36-48 hours please call the anesthesia department.  QUESTIONS?:  Please feel free to call your physician or the hospital operator if you have any questions, and they will be happy to assist you.      Knee Arthroscopy Knee arthroscopy is a surgical procedure that is used to examine the inside of your knee joint and repair any damage. The surgeon puts a small, lighted instrument with a camera on the tip (arthroscope) through a small incision in your knee. The camera sends pictures to a monitor in the operating room. Your surgeon uses those pictures to guide the surgical instruments through other incisions to the area of damage. Knee arthroscopy can be used to treat many types of knee problems. It may be used:  To repair a torn ligament.  To repair or remove damaged tissue.  To remove a fluid-filled sac (cyst) from your knee. LET [facility]ER KNOW ABOUT:  Any allergies you have.  All medicines you are taking, including vitamins, herbs, eye drops, creams, and over-the-counter medicines.  Previous problems you or members of your family have had with the use of anesthetics.  Any blood disorders you have.  Previous surgeries you have had.  Any medical conditions you may have. RISKS AND COMPLICATIONS Generally, this is a safe procedure. However, problems may occur, including:  Infection.  Bleeding.  Damage to blood vessels, nerves, or  structures of your knee.  A blood clot that forms in your leg and travels to your lung.  Failure to relieve symptoms. BEFORE THE PROCEDURE  Ask your health care provider about:  Changing or stopping your regular medicines. This is especially important if you are taking diabetes medicines or blood thinners.  Taking medicines such as aspirin and ibuprofen. These medicines can thin your blood. Do not take these medicines before your procedure if your  health care provider instructs you not to.  Follow your health care provider's instructions about eating or drinking restrictions.  Plan to have someone take you home after the procedure.  If you go home right after the procedure, plan to have someone with you for 24 hours.  Do not drink alcohol unless your health care provider says that you can.  Do not use any tobacco products, including cigarettes, chewing tobacco, or electronic cigarettes unless your health care provider says that you can. If you need help quitting, ask your health care provider.  You may have a physical exam. PROCEDURE  An IV tube will be inserted into one of your veins.  You will be given one or more of the following:  A medicine that helps you relax (sedative).  A medicine that numbs the area (local anesthetic).  A medicine that makes you fall asleep (general anesthetic).  A medicine that is injected into your spine that numbs the area below and slightly above the injection site (spinal anesthetic).  A medicine that is injected into an area of your body that numbs everything below the injection site (regional anesthetic).  A cuff may be placed around your upper leg to slow bleeding during the procedure.  The surgeon will make a small number of incisions around your knee.  Your knee joint will be flushed and filled with a germ-free (sterile) solution.  The arthroscope will be passed through an incision into your knee joint.  More instruments will be passed through other incisions to repair your knee as needed.  The fluid will be removed from your knee.  The incisions will be closed with adhesive strips or stitches (sutures).  A bandage (dressing) will be placed over your knee. The procedure may vary among health care providers and hospitals. AFTER THE PROCEDURE  Your blood pressure, heart rate, breathing rate and blood oxygen level will be monitored often until the medicines you were given have  worn off.  You may be given medicine for pain.  You may get crutches to help you walk without using your knee to support your body weight.  You may have to wear compression stockings. These stocking help to prevent blood clots and reduce swelling in your legs.   This information is not intended to replace advice given to you by your health care provider. Make sure you discuss any questions you have with your health care provider.   Document Released: 12/25/1999 Document Revised: 05/13/2014 Document Reviewed: 12/23/2013 Elsevier Interactive Patient Education [employer].

## 2015-06-02 ENCOUNTER — Telehealth: Payer: Self-pay | Admitting: *Deleted

## 2015-06-02 NOTE — Telephone Encounter (Signed)
No pre-cert required for CPT 29881 Left knee arthroscopy with medial menisectomy per Ethelene BrownsAnthony B. With [employer] Ref # [id number] Surgery scheduled for 06/11/15

## 2015-06-05 ENCOUNTER — Other Ambulatory Visit: Payer: Self-pay

## 2015-06-05 ENCOUNTER — Encounter: Payer: Self-pay | Admitting: Orthopedic Surgery

## 2015-06-05 ENCOUNTER — Encounter (HOSPITAL_COMMUNITY): Payer: Self-pay

## 2015-06-05 ENCOUNTER — Encounter (HOSPITAL_COMMUNITY)
Admission: RE | Admit: 2015-06-05 | Discharge: 2015-06-05 | Disposition: A | Discharge status: Home / Self Care | Payer: BLUE CROSS/BLUE SHIELD | Source: Ambulatory Visit | Attending: Orthopedic Surgery | Admitting: Orthopedic Surgery

## 2015-06-05 DIAGNOSIS — S83242A Other tear of medial meniscus, current injury, left knee, initial encounter: Secondary | ICD-10-CM | POA: Diagnosis not present

## 2015-06-05 DIAGNOSIS — Z01812 Encounter for preprocedural laboratory examination: Secondary | ICD-10-CM | POA: Diagnosis not present

## 2015-06-05 DIAGNOSIS — I10 Essential (primary) hypertension: Secondary | ICD-10-CM | POA: Diagnosis not present

## 2015-06-05 DIAGNOSIS — Y92512 Supermarket, store or market as the place of occurrence of the external cause: Secondary | ICD-10-CM | POA: Diagnosis not present

## 2015-06-05 DIAGNOSIS — Z01818 Encounter for other preprocedural examination: Secondary | ICD-10-CM | POA: Insufficient documentation

## 2015-06-05 DIAGNOSIS — X501XXA Overexertion from prolonged static or awkward postures, initial encounter: Secondary | ICD-10-CM | POA: Diagnosis not present

## 2015-06-05 HISTORY — DX: Essential (primary) hypertension: I10

## 2015-06-05 LAB — CBC
HCT: 34 % — ABNORMAL LOW (ref 36.0–46.0)
Hemoglobin: 11.6 g/dL — ABNORMAL LOW (ref 12.0–15.0)
MCH: 26.4 pg (ref 26.0–34.0)
MCHC: 34.1 g/dL (ref 30.0–36.0)
MCV: 77.3 fL — AB (ref 78.0–100.0)
PLATELETS: 322 10*3/uL (ref 150–400)
RBC: 4.4 MIL/uL (ref 3.87–5.11)
RDW: 15.3 % (ref 11.5–15.5)
WBC: 5.5 10*3/uL (ref 4.0–10.5)

## 2015-06-05 LAB — BASIC METABOLIC PANEL
Anion gap: 9 (ref 5–15)
BUN: 13 mg/dL (ref 6–20)
CALCIUM: 8.7 mg/dL — AB (ref 8.9–10.3)
CO2: 25 mmol/L (ref 22–32)
Chloride: 103 mmol/L (ref 101–111)
Creatinine, Ser: 0.53 mg/dL (ref 0.44–1.00)
GFR calc Af Amer: >60 mL/min (ref >60)
GLUCOSE: 98 mg/dL (ref 65–99)
POTASSIUM: 4.2 mmol/L (ref 3.5–5.1)
SODIUM: 137 mmol/L (ref 135–145)

## 2015-06-05 LAB — HCG, SERUM, QUALITATIVE: PREG SERUM: NEGATIVE

## 2015-06-10 NOTE — H&P (Signed)
Jodi Gray is an 45 y.o. female.   Chief Complaint: left knee pain  HPI: The patient twisted her knee on March 28 she had a previous patellofemoral arthroplasty in 2005. She was coming out of Jodi Gray twisted the knee presents with 7 out of 10 pain swelling stiffness left knee. Initial treatment immobilizer crutches and oral pain medication. She eventually had an MRI because the knee did not get better. The knee on MRI showed probable tear of the anterior horn medial meniscus and artifact from the previous surgery. She will have arthroscopic surgery left knee secondary ongoing pain with specific attention paid to the anterior horn medial meniscus  Past Medical History  Diagnosis Date  . Arthritis   . Hypertension     Past Surgical History  Procedure Laterality Date  . Knee surgery      right and left.  . Tubal ligation      No arthritis or heart disease in family history  Social History:  reports that she has been passively smoking.  She does not have any smokeless tobacco history on file. She reports that she drinks alcohol. She reports that she does not use illicit drugs.  Allergies:  Allergies  Allergen Reactions  . Lisinopril Hives and Swelling    Swelling to throat, lips.    No prescriptions prior to admission    No results found for this or any previous visit (from the past 48 hour(s)). No results found.  Review of Systems  Constitutional: Negative for fever, chills and weight loss.  Eyes: Negative for blurred vision.  Musculoskeletal: Negative for myalgias.  Neurological: Negative for seizures.  Endo/Heme/Allergies: Negative for environmental allergies and polydipsia. Does not bruise/bleed easily.  Psychiatric/Behavioral: Negative for hallucinations.  All other systems reviewed and are negative.   There were no vitals taken for this visit. Physical Exam  BP 132/92 mmHg  Pulse 78  Temp(Src) 97.9 F (36.6 C) (Oral)  Resp 14  SpO2 96%  Gen.  appearance patient is well developed well groomed on nourishment normal hygiene normal Oriented 3 Mood affect normal She has a slight favoring the left leg   The upper extremities show no contracture subluxation atrophy or tremor skin normal pulses good lymph nodes negative and normal sensation (right and left upper extremity)  On the left knee trephine anteromedial joint line tenderness small effusion knee flexion approximately 125. No instability. Strength normal. Skin normal. Pulses normal. Sensation normal. She has a mildly positive McMurray sign.  The she has no pathologic reflexes in the upper lower extremity she has normal balance and coordination  She has no lymphadenopathy   Assessment/Plan MRI shows probable tear anteromedial horn medial meniscus    IMPRESSION: 1. Postsurgical changes from previous patellofemoral arthroplasty and patellar realignment procedure. 2. Suspected degenerative tear of the anterior horn and body of the medial meniscus. 3. The lateral meniscus, cruciate and collateral ligaments appear intact. 4. 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     Electronically Signed   By: Carey BullocksWilliam  Veazey M.D.   On: 04/21/2015 09:29  Torn medial meniscus left knee  Arthroscopy left knee partial medial meniscectomy  [name], MD 06/10/2015, 5:47 PM

## 2015-06-11 ENCOUNTER — Encounter (HOSPITAL_COMMUNITY): Payer: Self-pay | Admitting: *Deleted

## 2015-06-11 ENCOUNTER — Encounter (HOSPITAL_COMMUNITY)
Admission: RE | Disposition: A | Discharge status: Home / Self Care | Payer: Self-pay | Source: Ambulatory Visit | Attending: Orthopedic Surgery

## 2015-06-11 ENCOUNTER — Ambulatory Visit (HOSPITAL_COMMUNITY): Payer: BLUE CROSS/BLUE SHIELD | Admitting: Anesthesiology

## 2015-06-11 ENCOUNTER — Ambulatory Visit (HOSPITAL_COMMUNITY)
Admission: RE | Admit: 2015-06-11 | Discharge: 2015-06-11 | Disposition: A | Discharge status: Home / Self Care | Payer: BLUE CROSS/BLUE SHIELD | Source: Ambulatory Visit | Attending: Orthopedic Surgery | Admitting: Orthopedic Surgery

## 2015-06-11 DIAGNOSIS — I10 Essential (primary) hypertension: Secondary | ICD-10-CM | POA: Insufficient documentation

## 2015-06-11 DIAGNOSIS — S83242A Other tear of medial meniscus, current injury, left knee, initial encounter: Secondary | ICD-10-CM | POA: Diagnosis present

## 2015-06-11 DIAGNOSIS — X501XXA Overexertion from prolonged static or awkward postures, initial encounter: Secondary | ICD-10-CM | POA: Diagnosis not present

## 2015-06-11 DIAGNOSIS — Z791 Long term (current) use of non-steroidal anti-inflammatories (NSAID): Secondary | ICD-10-CM | POA: Diagnosis not present

## 2015-06-11 DIAGNOSIS — M1990 Unspecified osteoarthritis, unspecified site: Secondary | ICD-10-CM | POA: Diagnosis not present

## 2015-06-11 DIAGNOSIS — Y9289 Other specified places as the place of occurrence of the external cause: Secondary | ICD-10-CM | POA: Diagnosis not present

## 2015-06-11 DIAGNOSIS — Z9689 Presence of other specified functional implants: Secondary | ICD-10-CM | POA: Insufficient documentation

## 2015-06-11 HISTORY — PX: KNEE ARTHROSCOPY WITH MEDIAL MENISECTOMY: SHX5651

## 2015-06-11 SURGERY — ARTHROSCOPY, KNEE, WITH MEDIAL MENISCECTOMY
Anesthesia: General | Site: Knee | Laterality: Left

## 2015-06-11 MED ORDER — HYDROCODONE-ACETAMINOPHEN 5-325 MG PO TABS
ORAL_TABLET | ORAL | Status: AC
  Filled 2015-06-11: qty 2

## 2015-06-11 MED ORDER — SODIUM CHLORIDE 0.9 % IR SOLN
Status: DC | PRN
  Administered 2015-06-11 (×5): 3000 mL

## 2015-06-11 MED ORDER — ONDANSETRON HCL 4 MG/2ML IJ SOLN
4.0000 mg | Freq: Once | INTRAMUSCULAR | Status: AC
  Administered 2015-06-11: 4 mg via INTRAVENOUS

## 2015-06-11 MED ORDER — FENTANYL CITRATE (PF) 100 MCG/2ML IJ SOLN
INTRAMUSCULAR | Status: AC
  Filled 2015-06-11: qty 2

## 2015-06-11 MED ORDER — HYDROCODONE-ACETAMINOPHEN 7.5-325 MG PO TABS: 1.0000 | ORAL_TABLET | ORAL | Status: DC | PRN

## 2015-06-11 MED ORDER — FENTANYL CITRATE (PF) 100 MCG/2ML IJ SOLN
25.0000 ug | INTRAMUSCULAR | Status: AC
  Administered 2015-06-11 (×2): 25 ug via INTRAVENOUS

## 2015-06-11 MED ORDER — DEXAMETHASONE SODIUM PHOSPHATE 4 MG/ML IJ SOLN
4.0000 mg | Freq: Once | INTRAMUSCULAR | Status: AC
  Administered 2015-06-11: 4 mg via INTRAVENOUS

## 2015-06-11 MED ORDER — CEFAZOLIN SODIUM-DEXTROSE 2-4 GM/100ML-% IV SOLN
INTRAVENOUS | Status: AC
  Filled 2015-06-11: qty 100

## 2015-06-11 MED ORDER — PROPOFOL 10 MG/ML IV BOLUS
INTRAVENOUS | Status: DC | PRN
  Administered 2015-06-11: 150 mg via INTRAVENOUS

## 2015-06-11 MED ORDER — MIDAZOLAM HCL 2 MG/2ML IJ SOLN
1.0000 mg | INTRAMUSCULAR | Status: DC | PRN
  Administered 2015-06-11: 2 mg via INTRAVENOUS

## 2015-06-11 MED ORDER — ONDANSETRON HCL 4 MG/2ML IJ SOLN: 4.0000 mg | Freq: Once | INTRAMUSCULAR | Status: DC | PRN

## 2015-06-11 MED ORDER — DEXAMETHASONE SODIUM PHOSPHATE 4 MG/ML IJ SOLN
INTRAMUSCULAR | Status: AC
  Filled 2015-06-11: qty 1

## 2015-06-11 MED ORDER — HYDROCODONE-ACETAMINOPHEN 5-325 MG PO TABS
2.0000 | ORAL_TABLET | Freq: Once | ORAL | Status: AC
  Administered 2015-06-11: 2 via ORAL

## 2015-06-11 MED ORDER — PROPOFOL 10 MG/ML IV BOLUS
INTRAVENOUS | Status: AC
  Filled 2015-06-11: qty 40

## 2015-06-11 MED ORDER — 0.9 % SODIUM CHLORIDE (POUR BTL) OPTIME
TOPICAL | Status: DC | PRN
  Administered 2015-06-11: 1000 mL

## 2015-06-11 MED ORDER — CHLORHEXIDINE GLUCONATE 4 % EX LIQD: 60.0000 mL | Freq: Once | CUTANEOUS | Status: DC

## 2015-06-11 MED ORDER — BUPIVACAINE-EPINEPHRINE (PF) 0.5% -1:200000 IJ SOLN
INTRAMUSCULAR | Status: DC | PRN
  Administered 2015-06-11: 60 mL

## 2015-06-11 MED ORDER — CEFAZOLIN SODIUM-DEXTROSE 2-4 GM/100ML-% IV SOLN
2.0000 g | INTRAVENOUS | Status: AC
  Administered 2015-06-11: 2 g via INTRAVENOUS

## 2015-06-11 MED ORDER — LIDOCAINE HCL (PF) 1 % IJ SOLN
INTRAMUSCULAR | Status: DC | PRN
  Administered 2015-06-11: 50 mg

## 2015-06-11 MED ORDER — IBUPROFEN 800 MG PO TABS: 800.0000 mg | ORAL_TABLET | Freq: Three times a day (TID) | ORAL | Status: DC | PRN

## 2015-06-11 MED ORDER — FENTANYL CITRATE (PF) 100 MCG/2ML IJ SOLN
INTRAMUSCULAR | Status: DC | PRN
  Administered 2015-06-11 (×3): 25 ug via INTRAVENOUS

## 2015-06-11 MED ORDER — BUPIVACAINE-EPINEPHRINE (PF) 0.5% -1:200000 IJ SOLN
INTRAMUSCULAR | Status: AC
  Filled 2015-06-11: qty 60

## 2015-06-11 MED ORDER — EPHEDRINE SULFATE 50 MG/ML IJ SOLN
INTRAMUSCULAR | Status: DC | PRN
  Administered 2015-06-11 (×4): 10 mg via INTRAVENOUS

## 2015-06-11 MED ORDER — ONDANSETRON HCL 4 MG/2ML IJ SOLN
INTRAMUSCULAR | Status: AC
  Filled 2015-06-11: qty 2

## 2015-06-11 MED ORDER — ONDANSETRON HCL 4 MG/2ML IJ SOLN: 4.0000 mg | Freq: Once | INTRAMUSCULAR | Status: DC

## 2015-06-11 MED ORDER — LACTATED RINGERS IV SOLN
INTRAVENOUS | Status: DC
  Administered 2015-06-11: 1000 mL via INTRAVENOUS
  Administered 2015-06-11: 09:00:00 via INTRAVENOUS

## 2015-06-11 MED ORDER — KETOROLAC TROMETHAMINE 30 MG/ML IJ SOLN
INTRAMUSCULAR | Status: AC
  Filled 2015-06-11: qty 1

## 2015-06-11 MED ORDER — FENTANYL CITRATE (PF) 100 MCG/2ML IJ SOLN
25.0000 ug | INTRAMUSCULAR | Status: DC | PRN
  Administered 2015-06-11 (×2): 25 ug via INTRAVENOUS

## 2015-06-11 MED ORDER — MIDAZOLAM HCL 2 MG/2ML IJ SOLN
INTRAMUSCULAR | Status: AC
  Filled 2015-06-11: qty 2

## 2015-06-11 MED ORDER — KETOROLAC TROMETHAMINE 30 MG/ML IJ SOLN
30.0000 mg | Freq: Once | INTRAMUSCULAR | Status: AC
  Administered 2015-06-11: 30 mg via INTRAVENOUS

## 2015-06-11 SURGICAL SUPPLY — 53 items
BAG HAMPER (MISCELLANEOUS) ×2 IMPLANT
BANDAGE ELASTIC 6 LF NS (GAUZE/BANDAGES/DRESSINGS) ×1 IMPLANT
BANDAGE ELASTIC 6 VELCRO NS (GAUZE/BANDAGES/DRESSINGS) ×2 IMPLANT
BLADE AGGRESSIVE PLUS 4.0 (BLADE) ×2 IMPLANT
BLADE SURG SZ11 CARB STEEL (BLADE) ×2 IMPLANT
BNDG CMPR MED 5X6 ELC HKLP NS (GAUZE/BANDAGES/DRESSINGS) ×1
CHLORAPREP W/TINT 26ML (MISCELLANEOUS) ×3 IMPLANT
CLOTH BEACON ORANGE TIMEOUT ST (SAFETY) ×2 IMPLANT
COOLER CRYO IC GRAV AND TUBE (ORTHOPEDIC SUPPLIES) ×2 IMPLANT
CUFF CRYO KNEE18X23 MED (MISCELLANEOUS) ×1 IMPLANT
CUFF TOURNIQUET SINGLE 34IN LL (TOURNIQUET CUFF) ×1 IMPLANT
CUTTER ANGLED DBL BITE 4.5 (BURR) IMPLANT
DECANTER SPIKE VIAL GLASS SM (MISCELLANEOUS) ×4 IMPLANT
GAUZE SPONGE 4X4 12PLY STRL (GAUZE/BANDAGES/DRESSINGS) ×2 IMPLANT
GAUZE SPONGE 4X4 16PLY XRAY LF (GAUZE/BANDAGES/DRESSINGS) ×2 IMPLANT
GAUZE XEROFORM 5X9 LF (GAUZE/BANDAGES/DRESSINGS) ×2 IMPLANT
GLOVE BIOGEL PI IND STRL 7.0 (GLOVE) ×1 IMPLANT
GLOVE BIOGEL PI INDICATOR 7.0 (GLOVE) ×2
GLOVE ECLIPSE 6.5 STRL STRAW (GLOVE) ×1 IMPLANT
GLOVE ECLIPSE 7.0 STRL STRAW (GLOVE) ×1 IMPLANT
GLOVE SKINSENSE NS SZ8.0 LF (GLOVE) ×1
GLOVE SKINSENSE STRL SZ8.0 LF (GLOVE) ×1 IMPLANT
GLOVE SS N UNI LF 8.5 STRL (GLOVE) ×2 IMPLANT
GOWN STRL REUS W/ TWL LRG LVL3 (GOWN DISPOSABLE) ×1 IMPLANT
GOWN STRL REUS W/TWL LRG LVL3 (GOWN DISPOSABLE) ×2
GOWN STRL REUS W/TWL XL LVL3 (GOWN DISPOSABLE) ×2 IMPLANT
HLDR LEG FOAM (MISCELLANEOUS) ×1 IMPLANT
IV NS IRRIG 3000ML ARTHROMATIC (IV SOLUTION) ×7 IMPLANT
KIT BLADEGUARD II DBL (SET/KITS/TRAYS/PACK) ×2 IMPLANT
KIT ROOM TURNOVER AP CYSTO (KITS) ×2 IMPLANT
LEG HOLDER FOAM (MISCELLANEOUS) ×1
MANIFOLD NEPTUNE II (INSTRUMENTS) ×2 IMPLANT
MARKER SKIN DUAL TIP RULER LAB (MISCELLANEOUS) ×2 IMPLANT
NDL HYPO 18GX1.5 BLUNT FILL (NEEDLE) ×1 IMPLANT
NDL HYPO 21X1.5 SAFETY (NEEDLE) ×1 IMPLANT
NDL SPNL 18GX3.5 QUINCKE PK (NEEDLE) ×1 IMPLANT
NEEDLE HYPO 18GX1.5 BLUNT FILL (NEEDLE) ×2 IMPLANT
NEEDLE HYPO 21X1.5 SAFETY (NEEDLE) ×2 IMPLANT
NEEDLE SPNL 18GX3.5 QUINCKE PK (NEEDLE) ×2 IMPLANT
NS IRRIG 1000ML POUR BTL (IV SOLUTION) ×2 IMPLANT
PACK ARTHRO LIMB DRAPE STRL (MISCELLANEOUS) ×2 IMPLANT
PAD ABD 5X9 TENDERSORB (GAUZE/BANDAGES/DRESSINGS) ×2 IMPLANT
PAD ARMBOARD 7.5X6 YLW CONV (MISCELLANEOUS) ×2 IMPLANT
PADDING CAST COTTON 6X4 STRL (CAST SUPPLIES) ×2 IMPLANT
PADDING WEBRIL 6 STERILE (GAUZE/BANDAGES/DRESSINGS) ×1 IMPLANT
SET ARTHROSCOPY INST (INSTRUMENTS) ×2 IMPLANT
SET ARTHROSCOPY PUMP TUBE (IRRIGATION / IRRIGATOR) ×2 IMPLANT
SET BASIN LINEN APH (SET/KITS/TRAYS/PACK) ×2 IMPLANT
SUT ETHILON 3 0 FSL (SUTURE) ×1 IMPLANT
SYR 30ML LL (SYRINGE) ×2 IMPLANT
SYRINGE 10CC LL (SYRINGE) ×2 IMPLANT
WAND 90 DEG TURBOVAC W/CORD (SURGICAL WAND) ×1 IMPLANT
YANKAUER SUCT BULB TIP 10FT TU (MISCELLANEOUS) ×7 IMPLANT

## 2015-06-11 NOTE — Interval H&P Note (Signed)
History and Physical Interval Note:  06/11/2015 7:29 AM  Jodi Gray T Amberg  has presented today for surgery, with the diagnosis of LEFT KNEE ARTHROSCOPY WITH MEDIAL MENISECTOMY  The various methods of treatment have been discussed with the patient and family. After consideration of risks, benefits and other options for treatment, the patient has consented to  Procedure(s) with comments: KNEE ARTHROSCOPY WITH MEDIAL MENISECTOMY (Left) - no crutch training as a surgical intervention .  The patient's history has been reviewed, patient examined, no change in status, stable for surgery.  I have reviewed the patient's chart and labs.  Questions were answered to the patient's satisfaction.     Fuller CanadaStanley Mccabe Gloria

## 2015-06-11 NOTE — Anesthesia Preprocedure Evaluation (Signed)
Anesthesia Evaluation  Patient identified by MRN, date of birth, ID band Patient awake    Reviewed: Allergy & Precautions, NPO status , Patient's Chart, lab work & pertinent test results  Airway Mallampati: I  TM Distance: >3 FB     Dental  (+) Teeth Intact   Pulmonary neg pulmonary ROS,    breath sounds clear to auscultation       Cardiovascular hypertension, Pt. on medications  Rhythm:Regular Rate:Normal     Neuro/Psych    GI/Hepatic negative GI ROS,   Endo/Other    Renal/GU      Musculoskeletal   Abdominal   Peds  Hematology   Anesthesia Other Findings   Reproductive/Obstetrics                             Anesthesia Physical Anesthesia Plan  ASA: II  Anesthesia Plan: General   Post-op Pain Management:    Induction: Intravenous  Airway Management Planned: LMA  Additional Equipment:   Intra-op Plan:   Post-operative Plan: Extubation in OR  Informed Consent: I have reviewed the patients History and Physical, chart, labs and discussed the procedure including the risks, benefits and alternatives for the proposed anesthesia with the patient or authorized representative who has indicated his/her understanding and acceptance.     Plan Discussed with:   Anesthesia Plan Comments:         Anesthesia Quick Evaluation  

## 2015-06-11 NOTE — Discharge Instructions (Signed)
Knee Arthroscopy, Care After °Refer to this sheet in the next few weeks. These instructions provide you with information about caring for yourself after your procedure. Your health care provider may also give you more specific instructions. Your treatment has been planned according to current medical practices, but problems sometimes occur. Call your health care provider if you have any problems or questions after your procedure. °WHAT TO EXPECT AFTER THE PROCEDURE °After your procedure, it is common to have: °· Soreness. °· Pain. °HOME CARE INSTRUCTIONS °Bathing °· Do not take baths, swim, or use a hot tub until your health care provider approves. °Incision Care °· There are many different ways to close and cover an incision, including stitches, skin glue, and adhesive strips. Follow your health care provider's instructions about: °¨ Incision care. °¨ Bandage (dressing) changes and removal. °¨ Incision closure removal. °· Check your incision area every day for signs of infection. Watch for: °¨ Redness, swelling, or pain. °¨ Fluid, blood, or pus. °Activity °· Avoid strenuous activities for as long as directed by your health care provider. °· Return to your normal activities as directed by your health care provider. Ask your health care provider what activities are safe for you. °· Perform range-of-motion exercises only as directed by your health care provider. °· Do not lift anything that is heavier than 10 lb (4.5 kg). °· Do not drive or operate heavy machinery while taking pain medicine. °· If you were given crutches, use them as directed by your health care provider. °Managing Pain, Stiffness, and Swelling °· If directed, apply ice to the injured area: °¨ Put ice in a plastic bag. °¨ Place a towel between your skin and the bag. °¨ Leave the ice on for 20 minutes, 2-3 times per day. °· Raise the injured area above the level of your heart while you are sitting or lying down as directed by your health care  provider. °General Instructions °· Keep all follow-up visits as directed by your health care provider. This is important. °· Take medicines only as directed by your health care provider. °· Do not use any tobacco products, including cigarettes, chewing tobacco, or electronic cigarettes. If you need help quitting, ask your health care provider. °· If you were given compression stockings, wear them as directed by your health care provider. These stockings help prevent blood clots and reduce swelling in your legs. °SEEK MEDICAL CARE IF: °· You have severe pain with any movement of your knee. °· You notice a bad smell coming from the incision or dressing. °· You have redness, swelling, or pain at the site of your incision. °· You have fluid, blood, or pus coming from your incision. °SEEK IMMEDIATE MEDICAL CARE IF: °· You develop a rash. °· You have a fever. °· You have difficulty breathing or have shortness of breath. °· You develop pain in your calves or in the back of your knee. °· You develop chest pain. °· You develop numbness or tingling in your leg or foot. °  °This information is not intended to replace advice given to you by your health care provider. Make sure you discuss any questions you have with your health care provider. °  °Document Released: 07/16/2004 Document Revised: 05/13/2014 Document Reviewed: 12/23/2013 °Elsevier Interactive Patient Education ©2016 Elsevier Inc. ° °

## 2015-06-11 NOTE — Brief Op Note (Signed)
06/11/2015  8:37 AM  PATIENT:  Tora Kindredhonda T Slay  45 y.o. female  PRE-OPERATIVE DIAGNOSIS:  LEFT MENISCUS TEAR  POST-OPERATIVE DIAGNOSIS:  LEFT MENISCUS TEAR  PROCEDURE:  Procedure(s): KNEE ARTHROSCOPY WITH MEDIAL MENISECTOMY (Left)  SURGEON:  Surgeon(s) and Role:    * Vickki HearingStanley E Harrison, MD - Primary  PHYSICIAN ASSISTANT:   ASSISTANTS: none   ANESTHESIA:   general  EBL:     BLOOD ADMINISTERED:none  DRAINS: none   LOCAL MEDICATIONS USED:  MARCAINE     SPECIMEN:  No Specimen  DISPOSITION OF SPECIMEN:  N/A  COUNTS:  YES  TOURNIQUET:    DICTATION: .Dragon Dictation  PLAN OF CARE: Discharge to home after PACU  PATIENT DISPOSITION:  PACU - hemodynamically stable.   Delay start of Pharmacological VTE agent (>24hrs) due to surgical blood loss or risk of bleeding: not applicable

## 2015-06-11 NOTE — Transfer of Care (Signed)
Immediate Anesthesia Transfer of Care Note  Patient: Jodi Gray  Procedure(s) Performed: Procedure(s): KNEE ARTHROSCOPY WITH MEDIAL MENISECTOMY (Left)  Patient Location: PACU  Anesthesia Type:General  Level of Consciousness: awake, alert , oriented and responds to stimulation  Airway & Oxygen Therapy: Patient Spontanous Breathing and Patient connected to nasal cannula oxygen  Post-op Assessment: Report given to RN, Post -op Vital signs reviewed and stable and Patient moving all extremities X 4  Post vital signs: Reviewed and stable  Last Vitals:  Filed Vitals:   06/11/15 0700 06/11/15 0715  BP: 132/92 140/90  Pulse:    Temp:    Resp: 14 14    Last Pain: There were no vitals filed for this visit.       Complications: No apparent anesthesia complications

## 2015-06-11 NOTE — Anesthesia Procedure Notes (Addendum)
Procedure Name: Intubation Date/Time: 06/11/2015 7:41 AM Performed by: Patrcia DollyMOSES, Gusta Marksberry Pre-anesthesia Checklist: Patient identified, Patient being monitored, Timeout performed, Emergency Drugs available and Suction available Patient Re-evaluated:Patient Re-evaluated prior to inductionOxygen Delivery Method: Circle system utilized Preoxygenation: Pre-oxygenation with 100% oxygen Intubation Type: IV induction Ventilation: Mask ventilation without difficulty LMA Size: 4.0 Number of attempts: 1 Placement Confirmation: positive ETCO2 and breath sounds checked- equal and bilateral Tube secured with: Tape Dental Injury: Teeth and Oropharynx as per pre-operative assessment

## 2015-06-11 NOTE — Anesthesia Postprocedure Evaluation (Signed)
Anesthesia Post Note  Patient: Jodi Gray  Procedure(s) Performed: Procedure(s) (LRB): KNEE ARTHROSCOPY WITH MEDIAL MENISECTOMY (Left)  Patient location during evaluation: PACU Anesthesia Type: General Level of consciousness: awake, awake and alert, oriented and patient cooperative Pain management: pain level controlled Vital Signs Assessment: post-procedure vital signs reviewed and stable Respiratory status: spontaneous breathing and patient connected to nasal cannula oxygen Cardiovascular status: stable Anesthetic complications: no    Last Vitals:  Filed Vitals:   06/11/15 0700 06/11/15 0715  BP: 132/92 140/90  Pulse:    Temp:    Resp: 14 14    Last Pain: There were no vitals filed for this visit.               Graceyn Fodor

## 2015-06-11 NOTE — Op Note (Signed)
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 anesthesia was used  The surgery was done as follows. The patient was identified in the preop area used to identification markers. Her chart was reviewed her leg was reexamined and she was deemed stable and appropriate for surgery.  She was taken to the operating room and general anesthesia was administered. She was in supine position. We used an arthroscopic leg holder. We is a well leg holder with padding. The left leg was prepped and draped sterilely  Timeout was then completed  A lateral portal was established, scope was placed in the joint. A diagnostic arthroscopy was done. A medial portal was established. The joint was debrided for better visualization and then we found an anterior horn medial meniscal tear which was resected we used a are arthroscopic shaver and ArthroCare wand. The posterior horn the medial meniscus was stable to anterior cruciate ligament was stable to PCL looked as if it had had some type of damage to it prior lateral compartment was normal. Patellofemoral joint was normal including the implant  The knee was then irrigated. We injected 60 mL of Marcaine with epinephrine and closed portals with 3-0 nylon  We applied a sterile dressing with an Ace bandage. We applied a Cryo/Cuff and activated. The patient was extubated taken recovery room in stable condition. Postoperative plan full weightbearing with crutches active range of motion exercises  in 48 hours  Follow-up in the office within a week  Norco 7.5 mg and ibuprofen milligrams for pain

## 2015-06-12 ENCOUNTER — Encounter (HOSPITAL_COMMUNITY): Payer: Self-pay | Admitting: Orthopedic Surgery

## 2015-06-15 ENCOUNTER — Encounter: Payer: Self-pay | Admitting: Orthopedic Surgery

## 2015-06-15 ENCOUNTER — Ambulatory Visit (INDEPENDENT_AMBULATORY_CARE_PROVIDER_SITE_OTHER): Payer: BLUE CROSS/BLUE SHIELD | Admitting: Orthopedic Surgery

## 2015-06-15 VITALS — BP 144/92 | HR 75 | Ht 67.0 in | Wt 177.0 lb

## 2015-06-15 DIAGNOSIS — Z4789 Encounter for other orthopedic aftercare: Secondary | ICD-10-CM

## 2015-06-15 DIAGNOSIS — Z9889 Other specified postprocedural states: Secondary | ICD-10-CM

## 2015-06-15 MED ORDER — HYDROCODONE-ACETAMINOPHEN 10-325 MG PO TABS: 1.0000 | ORAL_TABLET | ORAL | Status: DC | PRN

## 2015-06-15 MED ORDER — HYDROCODONE-ACETAMINOPHEN 7.5-325 MG PO TABS: 1.0000 | ORAL_TABLET | ORAL | Status: DC | PRN

## 2015-06-15 NOTE — Progress Notes (Signed)
Patient ID: Jodi Gray, female   DOB: 1970-05-21, 45 y.o.   MRN: [id number]  Chief Complaint  Patient presents with  . Follow-up    POST OP 1, SALK, DOS 06/11/15    The patient is status post knee arthroscopy as described. They're doing well without any major complaints. Pain is well controlled. 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

## 2015-06-15 NOTE — Patient Instructions (Signed)
Start physical therapy

## 2015-06-16 ENCOUNTER — Ambulatory Visit (HOSPITAL_COMMUNITY): Payer: BLUE CROSS/BLUE SHIELD | Attending: Orthopedic Surgery | Admitting: Physical Therapy

## 2015-06-16 DIAGNOSIS — R2689 Other abnormalities of gait and mobility: Secondary | ICD-10-CM | POA: Diagnosis present

## 2015-06-16 DIAGNOSIS — M25662 Stiffness of left knee, not elsewhere classified: Secondary | ICD-10-CM

## 2015-06-16 DIAGNOSIS — M25562 Pain in left knee: Secondary | ICD-10-CM | POA: Insufficient documentation

## 2015-06-16 NOTE — Therapy (Signed)
[facility] [address] [place], [place], [address] Phone: [phone]   Fax:  [phone]  Physical Therapy Evaluation  Patient Details  Name: Jodi KindredRhonda T Gray MRN: [id number] Date of Birth: 01/12/1970 Referring Provider: Fuller CanadaStanley Harrison  Encounter Date: 06/16/2015      PT End of Session - 06/16/15 1226    Visit Number 1   Number of Visits 18   Date for PT Re-Evaluation 07/07/15   Authorization Type BCBS   Authorization Time Period 06/16/2015 - 07/28/2015   Authorization - Visit Number 1   Authorization - Number of Visits 18   PT Start Time 1120   PT Stop Time 1205   PT Time Calculation (min) 45 min   Equipment Utilized During Treatment Gait belt  crutches   Activity Tolerance Patient tolerated treatment well   Behavior During Therapy WFL for tasks assessed/performed      Past Medical History  Diagnosis Date  . Arthritis   . Hypertension     Past Surgical History  Procedure Laterality Date  . Knee surgery      right and left.  . Tubal ligation    . Knee arthroscopy with medial menisectomy Left 06/11/2015    Procedure: KNEE ARTHROSCOPY WITH MEDIAL MENISECTOMY;  Surgeon: Vickki HearingStanley E Harrison, MD;  Location: [facility];  Service: Orthopedics;  Laterality: Left;    There were no vitals filed for this visit.       Subjective Assessment - 06/16/15 1122    Subjective March 28th.  On lunch, stepped up and to get some pretzels and fell.  Started doing some exercises on Saturday.  WBAT on the L LE.  No falls or close calls.  Plantar fascitis is still the same.  Very stiff when she transfers sit<>stand   Patient is accompained by: Family member   Pertinent History HTN & plantar faschitis   Limitations Standing   How long can you stand comfortably? with crutches for a few minutes   How long can you walk comfortably? with crutches for a few minutes.     Patient Stated Goals Hope to get better range, decreae pain, decrease swelling,  and go back to normal activities and exercise.  Water aerobics - nothing with any impact.    Currently in Pain? Yes   Pain Score 2    Pain Location Knee   Pain Orientation Left   Pain Descriptors / Indicators Aching   Pain Frequency Other (Comment)  When she is standing or moving.    Aggravating Factors  moving   Pain Relieving Factors medication, and ice - uses game ready   Effect of Pain on Daily Activities She states the pain makes her lazy and she doesn't want to do anything.    Multiple Pain Sites No            [facility] PT Assessment - 06/16/15 0001    Assessment   Medical Diagnosis L knee scope    Referring Provider Fuller CanadaStanley Harrison   Onset Date/Surgical Date 06/11/15   Next MD Visit 07/06/2015   Prior Therapy Yes for Plantar fascitis   Balance Screen   Has the patient fallen in the past 6 months Yes   How many times? 1   Has the patient had a decrease in activity level because of a fear of falling?  Yes   Is the patient reluctant to leave their home because of a fear of falling?  Yes  wants to be in better shape  Home Environment   Living Environment Private residence   Living Arrangements Spouse/significant other;Children   Available Help at Discharge Family   Type of Home House   Home Access Stairs to enter  2 steep steps with no HR's.    Home Layout Two level  3 steps to bedroom, no HR.    Home Equipment Crutches   Prior Function   Level of Independence Independent   Vocation Full time employment;Other (comment)  HR assistant - [employer]   Vocation Requirements sitting  would need to walk several minutes to get to her desk.   Observation/Other Assessments   Observations +1 on the L LE down to the ankle.    AROM   Right Knee Flexion --   Left Knee Extension 8   Left Knee Flexion 74   PROM   Left Knee Flexion 90   Strength   Right Hip Flexion 3/5   Right Hip Extension 3+/5   Right Hip ABduction 4/5   Left Hip Flexion 3/5   Left Hip Extension 3/5    Left Hip ABduction 3/5   Right Knee Flexion 5/5   Right Knee Extension 3+/5  Limited due to pain of L knee.    Left Knee Flexion 3/5   Left Knee Extension 3+/5   Ambulation/Gait   Ambulation Distance (Feet) 100 Feet   Assistive device Crutches  for 80ft   Gait Pattern Step-through pattern;Decreased arm swing - left;Decreased arm swing - right;Decreased step length - right;Decreased stride length;Left flexed knee in stance;Antalgic;Trunk flexed   Ambulation Surface Level;Indoor   Gait velocity --  decreased   Gait Comments Pt was able to correct posture with vc's, Mild unsteadiness with UE's in low guard.  Pt used UE's to catch balance on 2 occasions.                    [facility] Adult PT Treatment/Exercise - 06/16/15 0001    Knee/Hip Exercises: Supine   Quad Sets Strengthening;Left;10 reps   Quad Sets Limitations poor L quad contraction   Short Arc Quad Sets Strengthening;Left;1 set;10 reps   Heel Slides Strengthening;Left;1 set;10 reps  with towel under heel to reduce friction.                 PT Education - 06/16/15 1225    Education provided Yes   Education Details Pt educated on POC, as well as HEP, pain reduction using cryotherapy, as well as improvements with gait mechanics.    Person(s) Educated Patient;Other (comment)  significant other.    Methods Explanation;Handout;Tactile cues   Comprehension Verbalized understanding;Returned demonstration;Need further instruction;Tactile cues required          PT Short Term Goals - 06/16/15 1235    PT SHORT TERM GOAL #1   Title Pt will c/o no more than 1/10 pain in L knee during all weight bearing activities.    Time 3   Period Weeks   Status New   PT SHORT TERM GOAL #2   Title Pt will demonstrate increased AROM of L knee flexin to 90*, and increased L knee extension to 5* to improve gait mechanics.   Time 3   Period Weeks   Status New   PT SHORT TERM GOAL #3   Title Pt will demonstrate abiltiy to weight  bear equally through B LE's during gait with improved posture, heel strike, and toe-off to improve balance during functional gait.    Time 3   Period Weeks   Status  New   PT SHORT TERM GOAL #4   Title Pt will increase muscle strenth in all deficient areas of the hip and knee by 1 MMT  grade to improve balance and gait mechanics.    Time 3   Status New   PT SHORT TERM GOAL #5   Title Pt will be independent in with correctly and consistently performing HEP to improve strength required for functional mobiltiy tasks.    Time 3   Period Weeks   Status New           PT Long Term Goals - 06/16/15 1248    PT LONG TERM GOAL #1   Title Pt will express pain of 0/10 in L knee during all functional weight bearing activities to improve QOL and return to leisure activities.    Time 6   Period Weeks   Status New   PT LONG TERM GOAL #2   Title Pt will demonstrate improvement with L knee AROM flexion to 110*, and extension to 0* to improve mechanics of the joint during gait.    Time 6   Period Weeks   Status New   PT LONG TERM GOAL #3   Title Pt will report ambulating >1060ft over even and uneven surfaces with no LOB and good gait mechanics to promote safe community ambulation.   Time 6   Period Weeks   Status New   PT LONG TERM GOAL #4   Title Pt will demonstrate improvement of 2 MMT in all deficent muscle groups of the LEs to promote improved balance and gait.    Time 6   Period Weeks   Status New   PT LONG TERM GOAL #5   Title Pt will report being able to return to 20 min of exercise on elliptical or water aerobics 2-3 days/wk with no more than 1/10 pain during or after activity.    Time 6   Period Weeks   Status New               Plan - 06/16/15 1230    Clinical Impression Statement Pt presents today s/p L knee arthroscopy, and demonstrates impairments with decreased AROM of the L knee, decreased strength of B LE's, decreased standing and gait tolerance, sub-optimal gait  mechanics.  Pt is currently using B crutches for ambulation and demonstrates instability with gait when not using them.  Pt would benefit from continued skilled PT to optimize AROM, strength, decrease pain, decrease swelling, improve endurance, and balance to return to PLOF, and leisure activities such as water aerobics.     Rehab Potential Excellent   PT Frequency 3x / week   PT Duration 6 weeks   PT Treatment/Interventions ADLs/Self Care Home Management;Cryotherapy;Electrical Stimulation;Iontophoresis 4mg /ml Dexamethasone;DME Instruction;Gait training;Stair training;Functional mobility training;Therapeutic activities;Therapeutic exercise;Balance training;Neuromuscular re-education;Patient/family education;Manual techniques;Scar mobilization;Passive range of motion;Taping   PT Next Visit Plan Review HEP, and POC, and progress open chain quad strengthening, as well as B hip strengthening.  Progress gait without crutches.    PT Home Exercise Plan Given Quad sets, SAQ's, and heel slides. Encouraged icing every hour for 20 min at a time.    Consulted and Agree with Plan of Care Patient;Family member/caregiver   Family Member Consulted significant other      Patient will benefit from skilled therapeutic intervention in order to improve the following deficits and impairments:  Abnormal gait, Decreased activity tolerance, Decreased balance, Decreased endurance, Decreased knowledge of use of DME, Decreased range of motion, Decreased skin  integrity, Decreased strength, Difficulty walking, Hypomobility, Increased edema, Impaired flexibility  Visit Diagnosis: Pain in left knee - Plan: PT plan of care cert/re-cert  Other abnormalities of gait and mobility - Plan: PT plan of care cert/re-cert  Stiffness of left knee, not elsewhere classified - Plan: PT plan of care cert/re-cert     Problem List Patient Active Problem List   Diagnosis Date Noted  . Acute medial meniscus tear of left knee     Beth  Earma Nicolaou, PT, DPT X: [id number]   Nedra Hai, PT, DPT  [facility] [address] [place], [place], [address] Phone: [phone]   Fax:  [phone]  Name: Jodi Gray MRN: [id number] Date of Birth: 1970/11/20

## 2015-06-16 NOTE — Patient Instructions (Signed)
  QUAD SET WITH TOWEL UNDER HEEL  While lying or sitting with a small towel roll under your ankle, tighten your top thigh muscle to press the back of your knee downward towards the ground.   2x10 reps 3x's/day   SHORT ARC QUAD  - SAQ   Place a rolled up towel or object under your knee and slowly straighten your knee as your raise up  your foot.   2x10 reps 3x's/day   HEEL SLIDES - SUPINE  Lying on your back with knees straight, slide the affected heel towards your buttock as you bend your knee.   Hold a gentle stretch in this position and then return to original position.  2x10 reps 3x's/day

## 2015-06-17 ENCOUNTER — Ambulatory Visit (HOSPITAL_COMMUNITY): Payer: BLUE CROSS/BLUE SHIELD | Admitting: Physical Therapy

## 2015-06-17 DIAGNOSIS — R2689 Other abnormalities of gait and mobility: Secondary | ICD-10-CM

## 2015-06-17 DIAGNOSIS — M25562 Pain in left knee: Secondary | ICD-10-CM

## 2015-06-17 DIAGNOSIS — M25662 Stiffness of left knee, not elsewhere classified: Secondary | ICD-10-CM

## 2015-06-17 NOTE — Therapy (Signed)
[facility] [address] [place], [place], [address] Phone: [phone]   Fax:  [phone]  Physical Therapy Treatment  Patient Details  Name: Jodi Gray MRN: [id number] Date of Birth: 30-Jan-1970 Referring Provider: Fuller Canada  Encounter Date: 06/17/2015      PT End of Session - 06/17/15 1248    Visit Number 2   Number of Visits 18   Date for PT Re-Evaluation 07/07/15   Authorization Type BCBS   Authorization Time Period 06/16/2015 - 07/28/2015   Authorization - Visit Number 2   Authorization - Number of Visits 18   PT Start Time 0947   PT Stop Time 1028   PT Time Calculation (min) 41 min   Activity Tolerance Patient tolerated treatment well   Behavior During Therapy [facility] for tasks assessed/performed      Past Medical History  Diagnosis Date  . Arthritis   . Hypertension     Past Surgical History  Procedure Laterality Date  . Knee surgery      right and left.  . Tubal ligation    . Knee arthroscopy with medial menisectomy Left 06/11/2015    Procedure: KNEE ARTHROSCOPY WITH MEDIAL MENISECTOMY;  Surgeon: Vickki Hearing, MD;  Location: [facility];  Service: Orthopedics;  Laterality: Left;    There were no vitals filed for this visit.      Subjective Assessment - 06/17/15 0949    Subjective Patient arrives with crtuches and reports she is doing well today, pain around 3/10 and states that she did heel slides yesterday and is planning on starting rest of exercises today   Patient is accompained by: Family member   Patient Stated Goals Hope to get better range, decreae pain, decrease swelling, and go back to normal activities and exercise.  Water aerobics - nothing with any impact.    Currently in Pain? Yes   Pain Score 3    Pain Location Knee   Pain Orientation Left                         [facility] Adult PT Treatment/Exercise - 06/17/15 0001    Knee/Hip Exercises: Stretches   Active Hamstring  Stretch Both;3 reps;30 seconds   Active Hamstring Stretch Limitations L 12 inch box, R supine    Quad Stretch Left;3 reps;30 seconds   Quad Stretch Limitations prone    Gastroc Stretch Both;3 reps;30 seconds   Gastroc Stretch Limitations gastroc on slantboard    Knee/Hip Exercises: Standing   Heel Raises Both;1 set;10 reps   Heel Raises Limitations heel and toe    Terminal Knee Extension Limitations --   Rocker Board 2 minutes   Rocker Board Limitations lateral and [facility]    Knee/Hip Exercises: Supine   Quad Sets Left;1 set;15 reps   [employer] Limitations 3 second holds    Short Arc [employer] Left;10 reps   Short Arc [employer] Limitations 3 second holds    Straight Leg Raises Both;1 set;10 reps   Straight Leg Raises Limitations cues for quad activation    Other Supine Knee/Hip Exercises hip abduction 1x10 red TB      gait training with no device, min guard, heel-toe pattern approx 58ft; distance adjusted secondary to knee pain            PT Education - 06/17/15 1248    Education provided Yes   Education Details reviewed initial eval and goals    Person(s)  Educated Patient   Methods Explanation;Handout   Comprehension Verbalized understanding          PT Short Term Goals - 06/16/15 1235    PT SHORT TERM GOAL #1   Title Pt will c/o no more than 1/10 pain in L knee during all weight bearing activities.    Time 3   Period Weeks   Status New   PT SHORT TERM GOAL #2   Title Pt will demonstrate increased AROM of L knee flexin to 90*, and increased L knee extension to 5* to improve gait mechanics.   Time 3   Period Weeks   Status New   PT SHORT TERM GOAL #3   Title Pt will demonstrate abiltiy to weight bear equally through B LE's during gait with improved posture, heel strike, and toe-off to improve balance during functional gait.    Time 3   Period Weeks   Status New   PT SHORT TERM GOAL #4   Title Pt will increase muscle strenth in all deficient areas of the hip and  knee by 1 MMT  grade to improve balance and gait mechanics.    Time 3   Status New   PT SHORT TERM GOAL #5   Title Pt will be independent in with correctly and consistently performing HEP to improve strength required for functional mobiltiy tasks.    Time 3   Period Weeks   Status New           PT Long Term Goals - 06/16/15 1248    PT LONG TERM GOAL #1   Title Pt will express pain of 0/10 in L knee during all functional weight bearing activities to improve QOL and return to leisure activities.    Time 6   Period Weeks   Status New   PT LONG TERM GOAL #2   Title Pt will demonstrate improvement with L knee AROM flexion to 110*, and extension to 0* to improve mechanics of the joint during gait.    Time 6   Period Weeks   Status New   PT LONG TERM GOAL #3   Title Pt will report ambulating >1072ft over even and uneven surfaces with no LOB and good gait mechanics to promote safe community ambulation.   Time 6   Period Weeks   Status New   PT LONG TERM GOAL #4   Title Pt will demonstrate improvement of 2 MMT in all deficent muscle groups of the LEs to promote improved balance and gait.    Time 6   Period Weeks   Status New   PT LONG TERM GOAL #5   Title Pt will report being able to return to 20 min of exercise on elliptical or water aerobics 2-3 days/wk with no more than 1/10 pain during or after activity.    Time 6   Period Weeks   Status New               Plan - 06/17/15 1005    Clinical Impression Statement Focused onfunctional stretching, functional strength, and pre-gait/gait training today. Patient continues to demonstrate ongoing quad weakness as evidenced by poor quad activation during quad set and extensor lag during SLRs today. Patient did require cues for core activation during all exercises for form and safety today. Introduced CKC activities such as rockerboard  today, also continued wroking on heel-toe pattern as well today with min gurad. Reviewed initial  eval and goals at end of session, also provided supine  hip ABD and handout for exercise.    Rehab Potential Excellent   PT Frequency 3x / week   PT Duration 6 weeks   PT Treatment/Interventions ADLs/Self Care Home Management;Cryotherapy;Electrical Stimulation;Iontophoresis 4mg /ml Dexamethasone;DME Instruction;Gait training;Stair training;Functional mobility training;Therapeutic activities;Therapeutic exercise;Balance training;Neuromuscular re-education;Patient/family education;Manual techniques;Scar mobilization;Passive range of motion;Taping   PT Next Visit Plan progress open chain quad strengthening, as well as B hip strengthening.  Progress gait without crutches.    PT Home Exercise Plan gave supine hip ABD with red TB    Consulted and Agree with Plan of Care Patient   Family Member Consulted significant other      Patient will benefit from skilled therapeutic intervention in order to improve the following deficits and impairments:  Abnormal gait, Decreased activity tolerance, Decreased balance, Decreased endurance, Decreased knowledge of use of DME, Decreased range of motion, Decreased skin integrity, Decreased strength, Difficulty walking, Hypomobility, Increased edema, Impaired flexibility  Visit Diagnosis: Pain in left knee  Other abnormalities of gait and mobility  Stiffness of left knee, not elsewhere classified     Problem List Patient Active Problem List   Diagnosis Date Noted  . Acute medial meniscus tear of left knee     Nedra HaiKristen Unger PT, DPT [phone]  [facility] [address] [place], [place], [address] Phone: [phone]   Fax:  [phone]  Name: Tora KindredRhonda T Garciaperez MRN: [id number] Date of Birth: [birth year]

## 2015-06-17 NOTE — Patient Instructions (Signed)
   ELASTIC BAND - SUPINE HIP ABDUCTION  While lying on your back, slowly bring your leg out to the side. Keep  your knee straight the entire time. The band should go around your ankles, and your toes should stay pointing up to the ceiling the entire time.  Repeat 10 times each leg, 1-2 times per day.

## 2015-06-18 ENCOUNTER — Ambulatory Visit (HOSPITAL_COMMUNITY): Payer: BLUE CROSS/BLUE SHIELD | Admitting: Physical Therapy

## 2015-06-18 ENCOUNTER — Telehealth (HOSPITAL_COMMUNITY): Payer: Self-pay | Admitting: Physical Therapy

## 2015-06-18 DIAGNOSIS — M25662 Stiffness of left knee, not elsewhere classified: Secondary | ICD-10-CM

## 2015-06-18 DIAGNOSIS — R2689 Other abnormalities of gait and mobility: Secondary | ICD-10-CM

## 2015-06-18 DIAGNOSIS — M25562 Pain in left knee: Secondary | ICD-10-CM

## 2015-06-18 NOTE — Therapy (Signed)
[facility] [address] [place], [place], [address] Phone: [phone]   Fax:  [phone]  Physical Therapy Treatment  Patient Details  Name: Jodi Gray MRN: [id number] Date of Birth: 03-20-1970 Referring Provider: Fuller Canada  Encounter Date: 06/18/2015      PT End of Session - 06/18/15 1044    Visit Number 3   Number of Visits 18   Date for PT Re-Evaluation 07/07/15   Authorization Type BCBS   Authorization Time Period 06/16/2015 - 07/28/2015   Authorization - Visit Number 2   Authorization - Number of Visits 18   PT Start Time 0948   PT Stop Time 1028   PT Time Calculation (min) 40 min   Activity Tolerance Patient tolerated treatment well   Behavior During Therapy [facility] for tasks assessed/performed      Past Medical History  Diagnosis Date  . Arthritis   . Hypertension     Past Surgical History  Procedure Laterality Date  . Knee surgery      right and left.  . Tubal ligation    . Knee arthroscopy with medial menisectomy Left 06/11/2015    Procedure: KNEE ARTHROSCOPY WITH MEDIAL MENISECTOMY;  Surgeon: Vickki Hearing, MD;  Location: [facility];  Service: Orthopedics;  Laterality: Left;    There were no vitals filed for this visit.      Subjective Assessment - 06/18/15 0951    Subjective Patient arrives reporting that she was having some pain in her knee after yesterday's session, did ice at home and also started her exercises as planned    Pertinent History HTN & plantar faschitis   Patient Stated Goals Hope to get better range, decreae pain, decrease swelling, and go back to normal activities and exercise.  Water aerobics - nothing with any impact.    Currently in Pain? Yes   Pain Score 4    Pain Location Knee   Pain Orientation Left                         [facility] Adult PT Treatment/Exercise - 06/18/15 0001    Ambulation/Gait   Ambulation Distance (Feet) 300 Feet   Assistive device None    Gait Pattern Step-through pattern;Decreased arm swing - right;Decreased arm swing - left;Decreased stance time - left;Decreased step length - right;Decreased weight shift to right   Ambulation Surface Level;Indoor   Gait Comments min gurad for safety, occasional cues for heel-toe    Knee/Hip Exercises: Stretches   Active Hamstring Stretch Both;3 reps;30 seconds   Active Hamstring Stretch Limitations L 12 inch box, R supine    Quad Stretch --   Lobbyist Limitations --   Theme park manager Both;3 reps;30 seconds   Gastroc Stretch Limitations gastroc on slantboard    Knee/Hip Exercises: Seated   Long Arc Quad Left;1 set;10 reps   Long Arc Quad Weight 0 lbs.   Long [employer] Limitations 1 second holds    Hamstring Curl Left;1 set;10 reps   Hamstring Limitations 1 second hold     Hamstring Weights 0 lbs.   Knee/Hip Exercises: Supine   Quad Sets Left;15 reps;2 sets   [employer] Limitations 5 second holds   Short Arc [employer] Left;10 reps   Short Arc Quad Sets Limitations 3 second holds    Straight Leg Raises Both;1 set;10 reps   Straight Leg Raises Limitations cues for quad activation    Other Supine Knee/Hip Exercises hip  abduction 1x10 red TB    Manual Therapy   Manual Therapy Edema management   Manual therapy comments performed separtely of all other skilled PT services    Edema Management retrograde massage with LE elevated                 PT Education - 06/18/15 1043    Education provided Yes   Education Details conitnue icing and elevation; WBAT with LRAD within pain limitations   Person(s) Educated Patient   Methods Explanation   Comprehension Verbalized understanding          PT Short Term Goals - 06/16/15 1235    PT SHORT TERM GOAL #1   Title Pt will c/o no more than 1/10 pain in L knee during all weight bearing activities.    Time 3   Period Weeks   Status New   PT SHORT TERM GOAL #2   Title Pt will demonstrate increased AROM of L knee flexin to 90*,  and increased L knee extension to 5* to improve gait mechanics.   Time 3   Period Weeks   Status New   PT SHORT TERM GOAL #3   Title Pt will demonstrate abiltiy to weight bear equally through B LE's during gait with improved posture, heel strike, and toe-off to improve balance during functional gait.    Time 3   Period Weeks   Status New   PT SHORT TERM GOAL #4   Title Pt will increase muscle strenth in all deficient areas of the hip and knee by 1 MMT  grade to improve balance and gait mechanics.    Time 3   Status New   PT SHORT TERM GOAL #5   Title Pt will be independent in with correctly and consistently performing HEP to improve strength required for functional mobiltiy tasks.    Time 3   Period Weeks   Status New           PT Long Term Goals - 06/16/15 1248    PT LONG TERM GOAL #1   Title Pt will express pain of 0/10 in L knee during all functional weight bearing activities to improve QOL and return to leisure activities.    Time 6   Period Weeks   Status New   PT LONG TERM GOAL #2   Title Pt will demonstrate improvement with L knee AROM flexion to 110*, and extension to 0* to improve mechanics of the joint during gait.    Time 6   Period Weeks   Status New   PT LONG TERM GOAL #3   Title Pt will report ambulating >1099ft over even and uneven surfaces with no LOB and good gait mechanics to promote safe community ambulation.   Time 6   Period Weeks   Status New   PT LONG TERM GOAL #4   Title Pt will demonstrate improvement of 2 MMT in all deficent muscle groups of the LEs to promote improved balance and gait.    Time 6   Period Weeks   Status New   PT LONG TERM GOAL #5   Title Pt will report being able to return to 20 min of exercise on elliptical or water aerobics 2-3 days/wk with no more than 1/10 pain during or after activity.    Time 6   Period Weeks   Status New               Plan - 06/18/15 1045  Clinical Impression Statement Patient arrived  today reporting increased pain after last session, around 4/10, and reports she went to dollar store after PT session yesterday and was having quite a bit of pain yesterday afternoon. Focused on functional stretching and ROM based exercsies today; patient does require cues for form and continues to demonstrate considerable extension lag with SLRs and poor quad activation in general. Continued gait training with no assistive device today, approximately 32500ft with no increase in pain.    Rehab Potential Excellent   PT Frequency 3x / week   PT Duration 6 weeks   PT Treatment/Interventions ADLs/Self Care Home Management;Cryotherapy;Electrical Stimulation;Iontophoresis 4mg /ml Dexamethasone;DME Instruction;Gait training;Stair training;Functional mobility training;Therapeutic activities;Therapeutic exercise;Balance training;Neuromuscular re-education;Patient/family education;Manual techniques;Scar mobilization;Passive range of motion;Taping   PT Next Visit Plan progress open chain quad strengthening, as well as B hip strengthening.  Progress gait without crutches.    Consulted and Agree with Plan of Care Patient      Patient will benefit from skilled therapeutic intervention in order to improve the following deficits and impairments:  Abnormal gait, Decreased activity tolerance, Decreased balance, Decreased endurance, Decreased knowledge of use of DME, Decreased range of motion, Decreased skin integrity, Decreased strength, Difficulty walking, Hypomobility, Increased edema, Impaired flexibility  Visit Diagnosis: Pain in left knee  Other abnormalities of gait and mobility  Stiffness of left knee, not elsewhere classified     Problem List Patient Active Problem List   Diagnosis Date Noted  . Acute medial meniscus tear of left knee     Nedra HaiKristen Unger PT, DPT [phone]  [facility] [address] [place], [place], [address] Phone: [phone]   Fax:   [phone]  Name: Jodi Gray MRN: [id number] Date of Birth: 07/16/1970

## 2015-06-18 NOTE — Telephone Encounter (Signed)
Returned patient's call this afternoon. Patient reports that she needs PT clinic to fax copies of notes to date to her insurance rep so that they will continue her short term disability insurance payments- otherwise it will end today. This is not impacting her medical insurance and she reports she should still be covered to keep continuing with skilled PT services. PT advised that we will have front desk fax these notes as soon as possible.   Nedra HaiKristen Unger PT, DPT [phone]

## 2015-06-22 ENCOUNTER — Ambulatory Visit (HOSPITAL_COMMUNITY): Payer: BLUE CROSS/BLUE SHIELD | Admitting: Physical Therapy

## 2015-06-22 DIAGNOSIS — M25662 Stiffness of left knee, not elsewhere classified: Secondary | ICD-10-CM

## 2015-06-22 DIAGNOSIS — M25562 Pain in left knee: Secondary | ICD-10-CM | POA: Diagnosis not present

## 2015-06-22 DIAGNOSIS — R2689 Other abnormalities of gait and mobility: Secondary | ICD-10-CM

## 2015-06-22 NOTE — Therapy (Signed)
[facility] [address] [place], [place], [address] Phone: [phone]   Fax:  [phone]  Physical Therapy Treatment  Patient Details  Name: Jodi Gray MRN: [id number] Date of Birth: 1970/09/10 Referring Provider: Fuller CanadaStanley Harrison  Encounter Date: 06/22/2015    Past Medical History  Diagnosis Date  . Arthritis   . Hypertension     Past Surgical History  Procedure Laterality Date  . Knee surgery      right and left.  . Tubal ligation    . Knee arthroscopy with medial menisectomy Left 06/11/2015    Procedure: KNEE ARTHROSCOPY WITH MEDIAL MENISECTOMY;  Surgeon: Vickki HearingStanley E Harrison, MD;  Location: [facility];  Service: Orthopedics;  Laterality: Left;    There were no vitals filed for this visit.      Subjective Assessment - 06/22/15 1044    Subjective PT states her knee bothered her alot over the weekend due to being up on it alot, up to 8/10 and iced it alot.  Currently 4/10.  Continues to use her bilateral crutches with ambulation.   Currently in Pain? Yes   Pain Score 4    Pain Location Knee   Pain Orientation Left   Pain Descriptors / Indicators Aching   Pain Type Surgical pain                         [facility] Adult PT Treatment/Exercise - 06/22/15 0001    Ambulation/Gait   Gait Comments instructed with gait using 1 crutch   Knee/Hip Exercises: Stretches   Active Hamstring Stretch Both;3 reps;30 seconds   Active Hamstring Stretch Limitations B on 12" box   Gastroc Stretch Both;3 reps;30 seconds   Gastroc Stretch Limitations gastroc on slantboard    Knee/Hip Exercises: Standing   Heel Raises Both;1 set;10 reps   Heel Raises Limitations heel and toe    Forward Lunges Both;10 reps   Forward Lunges Limitations 4" box   Lateral Step Up Both;10 reps;Step Height: 4";Hand Hold: 1   Forward Step Up Both;10 reps;Hand Hold: 1;Step Height: 4"   Rocker Board 2 minutes   Rocker Board Limitations lateral and [facility]     Knee/Hip Exercises: Supine   Straight Leg Raises Both;1 set;15 reps   Straight Leg Raises Limitations cues for quad activation    Manual Therapy   Manual Therapy Edema management   Manual therapy comments performed separtely of all other skilled PT services    Edema Management retrograde massage with LE elevated                   PT Short Term Goals - 06/16/15 1235    PT SHORT TERM GOAL #1   Title Pt will c/o no more than 1/10 pain in L knee during all weight bearing activities.    Time 3   Period Weeks   Status New   PT SHORT TERM GOAL #2   Title Pt will demonstrate increased AROM of L knee flexin to 90*, and increased L knee extension to 5* to improve gait mechanics.   Time 3   Period Weeks   Status New   PT SHORT TERM GOAL #3   Title Pt will demonstrate abiltiy to weight bear equally through B LE's during gait with improved posture, heel strike, and toe-off to improve balance during functional gait.    Time 3   Period Weeks   Status New   PT SHORT TERM GOAL #4  Title Pt will increase muscle strenth in all deficient areas of the hip and knee by 1 MMT  grade to improve balance and gait mechanics.    Time 3   Status New   PT SHORT TERM GOAL #5   Title Pt will be independent in with correctly and consistently performing HEP to improve strength required for functional mobiltiy tasks.    Time 3   Period Weeks   Status New           PT Long Term Goals - 06/16/15 1248    PT LONG TERM GOAL #1   Title Pt will express pain of 0/10 in L knee during all functional weight bearing activities to improve QOL and return to leisure activities.    Time 6   Period Weeks   Status New   PT LONG TERM GOAL #2   Title Pt will demonstrate improvement with L knee AROM flexion to 110*, and extension to 0* to improve mechanics of the joint during gait.    Time 6   Period Weeks   Status New   PT LONG TERM GOAL #3   Title Pt will report ambulating >1078ft over even and uneven  surfaces with no LOB and good gait mechanics to promote safe community ambulation.   Time 6   Period Weeks   Status New   PT LONG TERM GOAL #4   Title Pt will demonstrate improvement of 2 MMT in all deficent muscle groups of the LEs to promote improved balance and gait.    Time 6   Period Weeks   Status New   PT LONG TERM GOAL #5   Title Pt will report being able to return to 20 min of exercise on elliptical or water aerobics 2-3 days/wk with no more than 1/10 pain during or after activity.    Time 6   Period Weeks   Status New               Plan - 06/22/15 1118    Clinical Impression Statement Progressed with standing exercises this session.  Pt able to complete all without c/o pain or noted diffiuclty.  Removed bandages and inspected surgery marks and is now fully healed.  Instructed with and encouraged pateint to complete scar massage as with noted build up of scar tissue in these areas.  Also encourged to continue to work on exercises to decrease extension lag and improve flexion.  Also instructed with gait using 1 crutch with ability to complete wtihout difiuclty.    Rehab Potential Excellent   PT Frequency 3x / week   PT Duration 6 weeks   PT Treatment/Interventions ADLs/Self Care Home Management;Cryotherapy;Electrical Stimulation;Iontophoresis 4mg /ml Dexamethasone;DME Instruction;Gait training;Stair training;Functional mobility training;Therapeutic activities;Therapeutic exercise;Balance training;Neuromuscular re-education;Patient/family education;Manual techniques;Scar mobilization;Passive range of motion;Taping   PT Next Visit Plan progress open chain quad strengthening, as well as B hip strengthening.  Progress gait without crutches.    Consulted and Agree with Plan of Care Patient      Patient will benefit from skilled therapeutic intervention in order to improve the following deficits and impairments:  Abnormal gait, Decreased activity tolerance, Decreased balance,  Decreased endurance, Decreased knowledge of use of DME, Decreased range of motion, Decreased skin integrity, Decreased strength, Difficulty walking, Hypomobility, Increased edema, Impaired flexibility  Visit Diagnosis: Pain in left knee  Other abnormalities of gait and mobility  Stiffness of left knee, not elsewhere classified     Problem List Patient Active Problem List   Diagnosis  Date Noted  . Acute medial meniscus tear of left knee     Lurena Nida, PTA/CLT [phone]  06/22/2015, 11:27 AM  [facility] [address] [place], [place], [address] Phone: [phone]   Fax:  [phone]  Name: Jodi Gray MRN: [id number] Date of Birth: Nov 15, 1970

## 2015-06-23 ENCOUNTER — Telehealth: Payer: Self-pay | Admitting: Orthopedic Surgery

## 2015-06-23 ENCOUNTER — Other Ambulatory Visit: Payer: Self-pay | Admitting: *Deleted

## 2015-06-23 MED ORDER — IBUPROFEN 800 MG PO TABS: 800.0000 mg | ORAL_TABLET | Freq: Three times a day (TID) | ORAL | Status: DC | PRN

## 2015-06-23 NOTE — Telephone Encounter (Signed)
Refilled per patient request. 

## 2015-06-23 NOTE — Telephone Encounter (Signed)
75

## 2015-06-23 NOTE — Telephone Encounter (Signed)
Patient requested a refill on Ibuprofen 800 mgs.  Qty 30

## 2015-06-23 NOTE — Telephone Encounter (Signed)
Patient called and requested a refill on Hydrocodone-Acetaminophen(Norco) 10-325 mgs.  Qty 42     °       °Sig: Take 1 tablet by mouth every 4 (four) hours as needed.   °  °  ° ° °

## 2015-06-23 NOTE — Telephone Encounter (Signed)
Routing to Dr Harrison for approval 

## 2015-06-24 ENCOUNTER — Ambulatory Visit (HOSPITAL_COMMUNITY): Payer: BLUE CROSS/BLUE SHIELD | Admitting: Physical Therapy

## 2015-06-24 DIAGNOSIS — M25562 Pain in left knee: Secondary | ICD-10-CM | POA: Diagnosis not present

## 2015-06-24 DIAGNOSIS — R2689 Other abnormalities of gait and mobility: Secondary | ICD-10-CM

## 2015-06-24 DIAGNOSIS — M25662 Stiffness of left knee, not elsewhere classified: Secondary | ICD-10-CM

## 2015-06-24 NOTE — Telephone Encounter (Signed)
Patient called to check on - Please advise if this pain medication refill prescription has been printed and signed per Dr Mort SawyersHarrison's note.

## 2015-06-24 NOTE — Telephone Encounter (Signed)
Patient aware.

## 2015-06-24 NOTE — Therapy (Signed)
[facility] [address] [place], [place], [address] Phone: [phone]   Fax:  [phone]  Physical Therapy Treatment  Patient Details  Name: Jodi Gray MRN: [id number] Date of Birth: 1970-02-21 Referring Provider: Fuller Canada  Encounter Date: 06/24/2015      PT End of Session - 06/24/15 1144    Visit Number 5   Number of Visits 18   Date for PT Re-Evaluation 07/07/15   Authorization Type BCBS   Authorization Time Period 06/16/2015 - 07/28/2015   PT Start Time 0948   PT Stop Time 1026   PT Time Calculation (min) 38 min   Activity Tolerance Patient tolerated treatment well   Behavior During Therapy [facility] for tasks assessed/performed      Past Medical History  Diagnosis Date  . Arthritis   . Hypertension     Past Surgical History  Procedure Laterality Date  . Knee surgery      right and left.  . Tubal ligation    . Knee arthroscopy with medial menisectomy Left 06/11/2015    Procedure: KNEE ARTHROSCOPY WITH MEDIAL MENISECTOMY;  Surgeon: Vickki Hearing, MD;  Location: [facility];  Service: Orthopedics;  Laterality: Left;    There were no vitals filed for this visit.      Subjective Assessment - 06/24/15 0950    Subjective Patient arrives reporting she was feeling good after last session; no pain this morning but reports she was a little sore earlier this morning    Currently in Pain? No/denies                         [facility] Adult PT Treatment/Exercise - 06/24/15 0001    Knee/Hip Exercises: Stretches   Active Hamstring Stretch Both;3 reps;30 seconds   Active Hamstring Stretch Limitations B on 12" box   Gastroc Stretch Both;3 reps;30 seconds   Gastroc Stretch Limitations gastroc on slantboard    Knee/Hip Exercises: Standing   Heel Raises Both;1 set;15 reps   Heel Raises Limitations heel and toe    Forward Lunges Both;10 reps   Forward Lunges Limitations 4" box   Lateral Step Up Both;10  reps;Step Height: 4";Hand Hold: 1   Forward Step Up Both;10 reps;Hand Hold: 1;Step Height: 4"   Pharmacist, hospital Limitations --   Knee/Hip Exercises: Seated   Long Arc Quad Left;1 set;15 reps   Long Arc Quad Weight 1 lbs.   Long [employer] Limitations 1 second holds    Hamstring Curl Left;1 set;15 reps   Hamstring Limitations 1 second hold     Hamstring Weights 1 lbs.   Knee/Hip Exercises: Supine   Quad Sets Left;15 reps   Quad Sets Limitations 10 second holds    Short Arc [employer] Left;15 reps   Short Arc Quad Sets Limitations 5 second hold    Straight Leg Raises --   Straight Leg Raises Limitations --   Manual Therapy   Manual Therapy Edema management   Manual therapy comments performed separtely of all other skilled PT services    Edema Management retrograde massage with LE elevated                 PT Education - 06/24/15 1144    Education provided No          PT Short Term Goals - 06/16/15 1235    PT SHORT TERM GOAL #1   Title Pt will c/o no  more than 1/10 pain in L knee during all weight bearing activities.    Time 3   Period Weeks   Status New   PT SHORT TERM GOAL #2   Title Pt will demonstrate increased AROM of L knee flexin to 90*, and increased L knee extension to 5* to improve gait mechanics.   Time 3   Period Weeks   Status New   PT SHORT TERM GOAL #3   Title Pt will demonstrate abiltiy to weight bear equally through B LE's during gait with improved posture, heel strike, and toe-off to improve balance during functional gait.    Time 3   Period Weeks   Status New   PT SHORT TERM GOAL #4   Title Pt will increase muscle strenth in all deficient areas of the hip and knee by 1 MMT  grade to improve balance and gait mechanics.    Time 3   Status New   PT SHORT TERM GOAL #5   Title Pt will be independent in with correctly and consistently performing HEP to improve strength required for functional mobiltiy tasks.    Time 3   Period Weeks    Status New           PT Long Term Goals - 06/16/15 1248    PT LONG TERM GOAL #1   Title Pt will express pain of 0/10 in L knee during all functional weight bearing activities to improve QOL and return to leisure activities.    Time 6   Period Weeks   Status New   PT LONG TERM GOAL #2   Title Pt will demonstrate improvement with L knee AROM flexion to 110*, and extension to 0* to improve mechanics of the joint during gait.    Time 6   Period Weeks   Status New   PT LONG TERM GOAL #3   Title Pt will report ambulating >102100ft over even and uneven surfaces with no LOB and good gait mechanics to promote safe community ambulation.   Time 6   Period Weeks   Status New   PT LONG TERM GOAL #4   Title Pt will demonstrate improvement of 2 MMT in all deficent muscle groups of the LEs to promote improved balance and gait.    Time 6   Period Weeks   Status New   PT LONG TERM GOAL #5   Title Pt will report being able to return to 20 min of exercise on elliptical or water aerobics 2-3 days/wk with no more than 1/10 pain during or after activity.    Time 6   Period Weeks   Status New               Plan - 06/24/15 1148    Clinical Impression Statement Continued working on functional stretching and also continued table work to assist in progressing quad activation/strength at this time. Increased weight for OKC exercises, and continued with CKC exercise as well with some occasional pain noted lateral and medial knee during CKC activities, however not consistent and was monitored throughout activities today. Pateint cotninues to demonstrate some extensor lag but this appears to be slowly improving. Ended session with retrograde massage to address edema in surgical LE.    Rehab Potential Excellent   PT Frequency 3x / week   PT Duration 6 weeks   PT Treatment/Interventions ADLs/Self Care Home Management;Cryotherapy;Electrical Stimulation;Iontophoresis 4mg /ml Dexamethasone;DME  Instruction;Gait training;Stair training;Functional mobility training;Therapeutic activities;Therapeutic exercise;Balance training;Neuromuscular re-education;Patient/family education;Manual techniques;Scar mobilization;Passive range  of motion;Taping   PT Next Visit Plan progrses OKC and CKC exercises, continue to address extensor lag. Progress gait as tolerated, continue to address edema.    Consulted and Agree with Plan of Care Patient      Patient will benefit from skilled therapeutic intervention in order to improve the following deficits and impairments:  Abnormal gait, Decreased activity tolerance, Decreased balance, Decreased endurance, Decreased knowledge of use of DME, Decreased range of motion, Decreased skin integrity, Decreased strength, Difficulty walking, Hypomobility, Increased edema, Impaired flexibility  Visit Diagnosis: Pain in left knee  Other abnormalities of gait and mobility  Stiffness of left knee, not elsewhere classified     Problem List Patient Active Problem List   Diagnosis Date Noted  . Acute medial meniscus tear of left knee     Nedra Hai PT, DPT [phone]  [facility] [address] [place], [place], [address] Phone: [phone]   Fax:  [phone]  Name: Jodi Gray MRN: [id number] Date of Birth: Jan 25, 1970

## 2015-06-25 ENCOUNTER — Other Ambulatory Visit: Payer: Self-pay | Admitting: *Deleted

## 2015-06-25 ENCOUNTER — Ambulatory Visit (HOSPITAL_COMMUNITY): Payer: BLUE CROSS/BLUE SHIELD | Admitting: Physical Therapy

## 2015-06-25 DIAGNOSIS — M25662 Stiffness of left knee, not elsewhere classified: Secondary | ICD-10-CM

## 2015-06-25 DIAGNOSIS — R2689 Other abnormalities of gait and mobility: Secondary | ICD-10-CM

## 2015-06-25 DIAGNOSIS — M25562 Pain in left knee: Secondary | ICD-10-CM | POA: Diagnosis not present

## 2015-06-25 DIAGNOSIS — Z4789 Encounter for other orthopedic aftercare: Secondary | ICD-10-CM

## 2015-06-25 DIAGNOSIS — Z9889 Other specified postprocedural states: Secondary | ICD-10-CM

## 2015-06-25 MED ORDER — HYDROCODONE-ACETAMINOPHEN 7.5-325 MG PO TABS: 1.0000 | ORAL_TABLET | Freq: Four times a day (QID) | ORAL | Status: DC | PRN

## 2015-06-25 NOTE — Patient Instructions (Signed)
Strengthening: Straight Leg Raise (Phase 1)    Tighten muscles on front of left  thigh, then lift leg __15__ inches from surface, keeping knee locked.  Repeat __15__ times per set. Do _1___ sets per session. Do __2__ sessions per day.  http://orth.exer.us/614   Copyright  VHI. All rights reserved.  Strengthening: Hip Abduction (Side-Lying)    Tighten muscles on front of left thigh, then lift leg __12__ inches from surface, keeping knee locked.  Repeat __15__ times per set. Do __1__ sets per session. Do __2__ sessions per day.  http://orth.exer.us/622   Copyright  VHI. All rights reserved.  Self-Mobilization: Knee Flexion (Prone)    Bring left heel toward buttocks as close as possible. Hold _3___ seconds. Relax. Repeat _15___ times per set. Do _1___ sets per session. Do __2__ sessions per day.  http://orth.exer.us/596   Copyright  VHI. All rights reserved.  Strengthening: Hip Extension (Prone)    Tighten muscles on front of left thigh, then lift leg __2__ inches from surface, keeping knee locked. Repeat __15__ times per set. Do _1___ sets per session. Do _2___ sessions per day.  http://orth.exer.us/620   Copyright  VHI. All rights reserved.  Bridging    Slowly raise buttocks from floor, keeping stomach tight. Repeat _15___ times per set. Do _1___ sets per session. Do ____2 sessions per day.  http://orth.exer.us/1096   Copyright  VHI. All rights reserved.

## 2015-06-25 NOTE — Therapy (Signed)
[facility] [address] [place], [place], [address] Phone: [phone]   Fax:  [phone]  Physical Therapy Treatment  Patient Details  Name: Jodi Gray MRN: [id number] Date of Birth: [birth year] Referring Provider: Fuller Canada  Encounter Date: 06/25/2015      PT End of Session - 06/25/15 0955    Visit Number 6   Number of Visits 18   Date for PT Re-Evaluation 07/07/15   Authorization Type BCBS   Authorization Time Period 06/16/2015 - 07/28/2015   Authorization - Visit Number 6   Authorization - Number of Visits 18   PT Start Time 0930   PT Stop Time 1008   PT Time Calculation (min) 38 min   Activity Tolerance Patient tolerated treatment well   Behavior During Therapy [facility] for tasks assessed/performed      Past Medical History  Diagnosis Date  . Arthritis   . Hypertension     Past Surgical History  Procedure Laterality Date  . Knee surgery      right and left.  . Tubal ligation    . Knee arthroscopy with medial menisectomy Left 06/11/2015    Procedure: KNEE ARTHROSCOPY WITH MEDIAL MENISECTOMY;  Surgeon: Vickki Hearing, MD;  Location: [facility];  Service: Orthopedics;  Laterality: Left;    There were no vitals filed for this visit.      Subjective Assessment - 06/25/15 0932    Subjective Pt states she has been icing; her knee has been having increased pain.  Pt states she has not been moving around much but has been icing.    Pain Score 5    Pain Location Leg   Pain Orientation Left   Pain Descriptors / Indicators Aching;Throbbing   Pain Type Surgical pain   Pain Frequency Constant   Aggravating Factors  walking    Pain Relieving Factors ice   Effect of Pain on Daily Activities increases                          [facility] Adult PT Treatment/Exercise - 06/25/15 0001    Knee/Hip Exercises: Stretches   Active Hamstring Stretch Both;3 reps;30 seconds   Active Hamstring Stretch Limitations Lt  supine    Knee/Hip Exercises: Aerobic   Nustep hills 3; level 3 x 8:00   Knee/Hip Exercises: Standing   Heel Raises 10 reps   Functional Squat 10 reps   Knee/Hip Exercises: Supine   Quad Sets 15 reps   Short Arc Quad Sets 15 reps   Short Arc Quad Sets Limitations 3#   Heel Slides 15 reps   Straight Leg Raises Left;15 reps   Knee/Hip Exercises: Sidelying   Hip ABduction 10 reps   Hip ABduction Limitations 3#   Knee/Hip Exercises: Prone   Hamstring Curl 10 reps   Hamstring Curl Limitations 3#                PT Education - 06/25/15 0950    Education provided Yes   Education Details new HEP ; pt to completed 10 quad sets every hour    Person(s) Educated Patient   Methods Explanation   Comprehension Verbalized understanding          PT Short Term Goals - 06/16/15 1235    PT SHORT TERM GOAL #1   Title Pt will c/o no more than 1/10 pain in L knee during all weight bearing activities.    Time 3  Period Weeks   Status New   PT SHORT TERM GOAL #2   Title Pt will demonstrate increased AROM of L knee flexin to 90*, and increased L knee extension to 5* to improve gait mechanics.   Time 3   Period Weeks   Status New   PT SHORT TERM GOAL #3   Title Pt will demonstrate abiltiy to weight bear equally through B LE's during gait with improved posture, heel strike, and toe-off to improve balance during functional gait.    Time 3   Period Weeks   Status New   PT SHORT TERM GOAL #4   Title Pt will increase muscle strenth in all deficient areas of the hip and knee by 1 MMT  grade to improve balance and gait mechanics.    Time 3   Status New   PT SHORT TERM GOAL #5   Title Pt will be independent in with correctly and consistently performing HEP to improve strength required for functional mobiltiy tasks.    Time 3   Period Weeks   Status New           PT Long Term Goals - 06/25/15 0957    PT LONG TERM GOAL #1   Title Pt will express pain of 0/10 in L knee during all  functional weight bearing activities to improve QOL and return to leisure activities.    Time 6   Period Weeks   Status New   PT LONG TERM GOAL #2   Title Pt will demonstrate improvement with L knee AROM flexion to 110*, and extension to 0* to improve mechanics of the joint during gait.    Time 6   Period Weeks   Status New   PT LONG TERM GOAL #3   Title Pt will report ambulating >1039ft over even and uneven surfaces with no LOB and good gait mechanics to promote safe community ambulation.   Time 6   Period Weeks   Status New   PT LONG TERM GOAL #4   Title Pt will demonstrate improvement of 2 MMT in all deficent muscle groups of the LEs to promote improved balance and gait.    Time 6   Period Weeks   Status New   PT LONG TERM GOAL #5   Title Pt will report being able to return to 20 min of exercise on elliptical or water aerobics 2-3 days/wk with no more than 1/10 pain during or after activity.    Time 6   Period Weeks   Status New               Plan - 06/25/15 0956    Clinical Impression Statement Limited  closed chained activity due to pt complaint of pain.. Pt has increased edema around incisions.  Added prone exerecises, weight  and nuestep to program.  Nustep was no calculated in charged time.(pt actually left facility at 1018.    Rehab Potential Excellent   PT Frequency 3x / week   PT Duration 6 weeks   PT Treatment/Interventions ADLs/Self Care Home Management;Cryotherapy;Electrical Stimulation;Iontophoresis /ml Dexamethasone;DME Instruction;Gait training;Stair training;Functional mobility training;Therapeutic activities;Therapeutic exercise;Balance training;Neuromuscular re-education;Patient/family education;Manual techniques;Scar mobilization;Passive range of motion;Taping   PT Next Visit Plan begin manual to decrease edema, add closed chain activity back into routing    Consulted and Agree with Plan of Care Patient      Patient will benefit from skilled  therapeutic intervention in order to improve the following deficits and impairments:  Abnormal gait, Decreased  activity tolerance, Decreased balance, Decreased endurance, Decreased knowledge of use of DME, Decreased range of motion, Decreased skin integrity, Decreased strength, Difficulty walking, Hypomobility, Increased edema, Impaired flexibility  Visit Diagnosis: Pain in left knee  Other abnormalities of gait and mobility  Stiffness of left knee, not elsewhere classified     Problem List Patient Active Problem List   Diagnosis Date Noted  . Acute medial meniscus tear of left knee    Virgina OrganCynthia Russell, PT CLT [phone] 06/25/2015, 12:22 PM  [facility] [address] [place], [place], [address] Phone: [phone]   Fax:  [phone]  Name: Jodi Gray MRN: [id number] Date of Birth: 04-06-1970

## 2015-06-29 ENCOUNTER — Ambulatory Visit (HOSPITAL_COMMUNITY): Payer: BLUE CROSS/BLUE SHIELD | Admitting: Physical Therapy

## 2015-06-29 DIAGNOSIS — M25662 Stiffness of left knee, not elsewhere classified: Secondary | ICD-10-CM

## 2015-06-29 DIAGNOSIS — M25562 Pain in left knee: Secondary | ICD-10-CM

## 2015-06-29 DIAGNOSIS — R2689 Other abnormalities of gait and mobility: Secondary | ICD-10-CM

## 2015-06-29 NOTE — Therapy (Signed)
[facility] [address] [place], [place], [address] Phone: [phone]   Fax:  [phone]  Physical Therapy Treatment  Patient Details  Name: Jodi Gray MRN: [id number] Date of Birth: [birth year] Referring Provider: Fuller Canada  Encounter Date: 06/29/2015      PT End of Session - 06/29/15 1118    Visit Number 7   Number of Visits 18   Date for PT Re-Evaluation 07/07/15   Authorization Type BCBS   Authorization Time Period 06/16/2015 - 07/28/2015   Authorization - Visit Number 7   Authorization - Number of Visits 18   PT Start Time 1042   PT Stop Time 1128  last 8 minutes not counted for nustep   PT Time Calculation (min) 46 min   Activity Tolerance Patient tolerated treatment well   Behavior During Therapy [facility] for tasks assessed/performed      Past Medical History  Diagnosis Date  . Arthritis   . Hypertension     Past Surgical History  Procedure Laterality Date  . Knee surgery      right and left.  . Tubal ligation    . Knee arthroscopy with medial menisectomy Left 06/11/2015    Procedure: KNEE ARTHROSCOPY WITH MEDIAL MENISECTOMY;  Surgeon: Vickki Hearing, MD;  Location: [facility];  Service: Orthopedics;  Laterality: Left;    There were no vitals filed for this visit.      Subjective Assessment - 06/29/15 1055    Subjective Pt was 11 mins late for appt.  continues to use 1 crutch and has MD appt Wednesday.  Pt states she was up on it alot over the weekend helping prepare for a cookout but iced her LE several times.  States it felt tight but no increased pain.                          [facility] Adult PT Treatment/Exercise - 06/29/15 1049    Knee/Hip Exercises: Stretches   Active Hamstring Stretch Both;3 reps;30 seconds   Active Hamstring Stretch Limitations B on 12" step   Gastroc Stretch Both;3 reps;30 seconds   Gastroc Stretch Limitations gastroc on slantboard    Knee/Hip Exercises:  Aerobic   Nustep hills 3; level 3 x 8:00  not included in billing at end of session   Knee/Hip Exercises: Standing   Heel Raises 15 reps   Heel Raises Limitations heel and toe    Forward Lunges Both;10 reps   Forward Lunges Limitations 4" box   Lateral Step Up Both;10 reps;Step Height: 4";Hand Hold: 1   Forward Step Up Both;10 reps;Hand Hold: 1;Step Height: 4"   Step Down Left;10 reps;Hand Hold: 1;Step Height: 4"   Functional Squat 10 reps   SLS 30" on Lt LE no UE assist   Knee/Hip Exercises: Supine   Straight Leg Raises Left;15 reps   Straight Leg Raises Limitations 3#   Knee/Hip Exercises: Sidelying   Hip ABduction 10 reps   Hip ABduction Limitations 3#   Knee/Hip Exercises: Prone   Hamstring Curl 10 reps   Hamstring Curl Limitations 3#   Hip Extension 10 reps   Hip Extension Limitations 3#                  PT Short Term Goals - 06/16/15 1235    PT SHORT TERM GOAL #1   Title Pt will c/o no more than 1/10 pain in L knee during all weight bearing activities.  Time 3   Period Weeks   Status New   PT SHORT TERM GOAL #2   Title Pt will demonstrate increased AROM of L knee flexin to 90*, and increased L knee extension to 5* to improve gait mechanics.   Time 3   Period Weeks   Status New   PT SHORT TERM GOAL #3   Title Pt will demonstrate abiltiy to weight bear equally through B LE's during gait with improved posture, heel strike, and toe-off to improve balance during functional gait.    Time 3   Period Weeks   Status New   PT SHORT TERM GOAL #4   Title Pt will increase muscle strenth in all deficient areas of the hip and knee by 1 MMT  grade to improve balance and gait mechanics.    Time 3   Status New   PT SHORT TERM GOAL #5   Title Pt will be independent in with correctly and consistently performing HEP to improve strength required for functional mobiltiy tasks.    Time 3   Period Weeks   Status New           PT Long Term Goals - 06/25/15 0957     PT LONG TERM GOAL #1   Title Pt will express pain of 0/10 in L knee during all functional weight bearing activities to improve QOL and return to leisure activities.    Time 6   Period Weeks   Status New   PT LONG TERM GOAL #2   Title Pt will demonstrate improvement with L knee AROM flexion to 110*, and extension to 0* to improve mechanics of the joint during gait.    Time 6   Period Weeks   Status New   PT LONG TERM GOAL #3   Title Pt will report ambulating >108300ft over even and uneven surfaces with no LOB and good gait mechanics to promote safe community ambulation.   Time 6   Period Weeks   Status New   PT LONG TERM GOAL #4   Title Pt will demonstrate improvement of 2 MMT in all deficent muscle groups of the LEs to promote improved balance and gait.    Time 6   Period Weeks   Status New   PT LONG TERM GOAL #5   Title Pt will report being able to return to 20 min of exercise on elliptical or water aerobics 2-3 days/wk with no more than 1/10 pain during or after activity.    Time 6   Period Weeks   Status New               Plan - 06/29/15 1119    Clinical Impression Statement Contineud established therex with addition of SLR in supine and prone with 3# weight and SLS.  PT able to stand on Lt LE without UE assist for 30 seconds without c/o pain.  Encouraged to try to ambulate more inside house without AD.  Completed session wtih nustep at end of session.    Rehab Potential Excellent   PT Frequency 3x / week   PT Duration 6 weeks   PT Treatment/Interventions ADLs/Self Care Home Management;Cryotherapy;Electrical Stimulation;Iontophoresis 4mg /ml Dexamethasone;DME Instruction;Gait training;Stair training;Functional mobility training;Therapeutic activities;Therapeutic exercise;Balance training;Neuromuscular re-education;Patient/family education;Manual techniques;Scar mobilization;Passive range of motion;Taping   PT Next Visit Plan Complete manual PRN  to decrease edema.  Complete  progress note next session as has MD appt following.    Consulted and Agree with Plan of Care Patient  Patient will benefit from skilled therapeutic intervention in order to improve the following deficits and impairments:  Abnormal gait, Decreased activity tolerance, Decreased balance, Decreased endurance, Decreased knowledge of use of DME, Decreased range of motion, Decreased skin integrity, Decreased strength, Difficulty walking, Hypomobility, Increased edema, Impaired flexibility  Visit Diagnosis: Pain in left knee  Other abnormalities of gait and mobility  Stiffness of left knee, not elsewhere classified     Problem List Patient Active Problem List   Diagnosis Date Noted  . Acute medial meniscus tear of left knee     Lurena Nida, PTA/CLT [phone]  06/29/2015, 11:22 AM  [facility] [address] [place], [place], [address] Phone: [phone]   Fax:  [phone]  Name: Jodi Gray MRN: [id number] Date of Birth: December 21, 1970

## 2015-07-01 ENCOUNTER — Telehealth (HOSPITAL_COMMUNITY): Payer: Self-pay | Admitting: Physical Therapy

## 2015-07-01 ENCOUNTER — Ambulatory Visit (HOSPITAL_COMMUNITY): Payer: BLUE CROSS/BLUE SHIELD | Admitting: Physical Therapy

## 2015-07-01 DIAGNOSIS — M25562 Pain in left knee: Secondary | ICD-10-CM | POA: Diagnosis not present

## 2015-07-01 DIAGNOSIS — R2689 Other abnormalities of gait and mobility: Secondary | ICD-10-CM

## 2015-07-01 DIAGNOSIS — M25662 Stiffness of left knee, not elsewhere classified: Secondary | ICD-10-CM

## 2015-07-01 NOTE — Therapy (Signed)
[facility] [address] [place], [place], [address] Phone: [phone]   Fax:  [phone]  Physical Therapy Treatment  Patient Details  Name: Jodi Gray MRN: [id number] Date of Birth: 02-17-1970 Referring Provider: Fuller Canada  Encounter Date: 07/01/2015      PT End of Session - 07/01/15 1110    Visit Number 8   Number of Visits 18   Date for PT Re-Evaluation 07/07/15   Authorization Type BCBS   Authorization Time Period 06/16/2015 - 07/28/2015   Authorization - Visit Number 8   Authorization - Number of Visits 18   PT Start Time 1032   PT Stop Time 1115   PT Time Calculation (min) 43 min   Activity Tolerance Patient tolerated treatment well   Behavior During Therapy [facility] for tasks assessed/performed      Past Medical History  Diagnosis Date  . Arthritis   . Hypertension     Past Surgical History  Procedure Laterality Date  . Knee surgery      right and left.  . Tubal ligation    . Knee arthroscopy with medial menisectomy Left 06/11/2015    Procedure: KNEE ARTHROSCOPY WITH MEDIAL MENISECTOMY;  Surgeon: Vickki Hearing, MD;  Location: [facility];  Service: Orthopedics;  Laterality: Left;    There were no vitals filed for this visit.      Subjective Assessment - 07/01/15 1619    Subjective PT states she is achey this morning. Reports pain is 5/10.                         [facility] Adult PT Treatment/Exercise - 07/01/15 0001    Knee/Hip Exercises: Stretches   Active Hamstring Stretch Both;3 reps;30 seconds   Active Hamstring Stretch Limitations B on 12" step   Gastroc Stretch Both;3 reps;30 seconds   Gastroc Stretch Limitations gastroc on slantboard    Knee/Hip Exercises: Standing   Heel Raises 15 reps   Heel Raises Limitations heel and toe    Forward Lunges Both;10 reps   Forward Lunges Limitations 4" box   Side Lunges Both;10 reps   Side Lunges Limitations 4" box   Lateral Step Up  Both;15 reps;Step Height: 4";Hand Hold: 1   Forward Step Up Both;Hand Hold: 1;Step Height: 4";15 reps   Step Down Left;Hand Hold: 1;Step Height: 4";15 reps   Functional Squat 15 reps   Knee/Hip Exercises: Supine   Straight Leg Raises Left;15 reps   Straight Leg Raises Limitations 4#   Knee/Hip Exercises: Sidelying   Hip ABduction 15 reps   Hip ABduction Limitations 4#   Knee/Hip Exercises: Prone   Hamstring Curl 15 reps   Hamstring Curl Limitations 4#   Hip Extension 15 reps   Hip Extension Limitations 4#                  PT Short Term Goals - 06/16/15 1235    PT SHORT TERM GOAL #1   Title Pt will c/o no more than 1/10 pain in L knee during all weight bearing activities.    Time 3   Period Weeks   Status New   PT SHORT TERM GOAL #2   Title Pt will demonstrate increased AROM of L knee flexin to 90*, and increased L knee extension to 5* to improve gait mechanics.   Time 3   Period Weeks   Status New   PT SHORT TERM GOAL #3   Title Pt  will demonstrate abiltiy to weight bear equally through B LE's during gait with improved posture, heel strike, and toe-off to improve balance during functional gait.    Time 3   Period Weeks   Status New   PT SHORT TERM GOAL #4   Title Pt will increase muscle strenth in all deficient areas of the hip and knee by 1 MMT  grade to improve balance and gait mechanics.    Time 3   Status New   PT SHORT TERM GOAL #5   Title Pt will be independent in with correctly and consistently performing HEP to improve strength required for functional mobiltiy tasks.    Time 3   Period Weeks   Status New           PT Long Term Goals - 06/25/15 0957    PT LONG TERM GOAL #1   Title Pt will express pain of 0/10 in L knee during all functional weight bearing activities to improve QOL and return to leisure activities.    Time 6   Period Weeks   Status New   PT LONG TERM GOAL #2   Title Pt will demonstrate improvement with L knee AROM flexion to  110*, and extension to 0* to improve mechanics of the joint during gait.    Time 6   Period Weeks   Status New   PT LONG TERM GOAL #3   Title Pt will report ambulating >1090ft over even and uneven surfaces with no LOB and good gait mechanics to promote safe community ambulation.   Time 6   Period Weeks   Status New   PT LONG TERM GOAL #4   Title Pt will demonstrate improvement of 2 MMT in all deficent muscle groups of the LEs to promote improved balance and gait.    Time 6   Period Weeks   Status New   PT LONG TERM GOAL #5   Title Pt will report being able to return to 20 min of exercise on elliptical or water aerobics 2-3 days/wk with no more than 1/10 pain during or after activity.    Time 6   Period Weeks   Status New               Plan - 07/01/15 1111    Clinical Impression Statement Pt continues to be reluctant to practice ambulation without crutch as she feels unstable and her leg feels heavy. Encouraged patient to increase this at home. Increased to 4# weight and added lateral lunges to POC without difficulty.  Medical records faxed to [employer] by request (see telephone encounter).  Pt returns to MD on 6/26 and will need PN for this visit.    Rehab Potential Excellent   PT Frequency 3x / week   PT Duration 6 weeks   PT Treatment/Interventions ADLs/Self Care Home Management;Cryotherapy;Electrical Stimulation;Iontophoresis 4mg /ml Dexamethasone;DME Instruction;Gait training;Stair training;Functional mobility training;Therapeutic activities;Therapeutic exercise;Balance training;Neuromuscular re-education;Patient/family education;Manual techniques;Scar mobilization;Passive range of motion;Taping   PT Next Visit Plan Complete manual PRN  to decrease edema.  Complete progress note in 2 more sessions as has MD appt following.    Consulted and Agree with Plan of Care Patient      Patient will benefit from skilled therapeutic intervention in order to improve the following deficits  and impairments:  Abnormal gait, Decreased activity tolerance, Decreased balance, Decreased endurance, Decreased knowledge of use of DME, Decreased range of motion, Decreased skin integrity, Decreased strength, Difficulty walking, Hypomobility, Increased edema, Impaired flexibility  Visit  Diagnosis: Pain in left knee  Other abnormalities of gait and mobility  Stiffness of left knee, not elsewhere classified     Problem List Patient Active Problem List   Diagnosis Date Noted  . Acute medial meniscus tear of left knee     Lurena Nidamy B Remiel Corti, PTA/CLT [phone]  07/01/2015, 4:22 PM  [facility] [address] [place], [place], [address] Phone: [phone]   Fax:  [phone]  Name: Jodi Gray MRN: [id number] Date of Birth: 1970/12/27

## 2015-07-01 NOTE — Telephone Encounter (Signed)
Patient requested medical records be sent to Daiva EvesMegan Gooden with [employer], fax number is [phone]  Records for 06/16/15 through 06/18/15 were sent by Emmit AlexandersNancy French on 06/18/15 Records for 06/22/15 through 07/01/15 were sent by Emeline GinsAmy Dorethy Tomey on 07/01/15  Lurena NidaAmy B Janat Tabbert, PTA/CLT [phone]

## 2015-07-02 ENCOUNTER — Ambulatory Visit (HOSPITAL_COMMUNITY): Payer: BLUE CROSS/BLUE SHIELD | Admitting: Physical Therapy

## 2015-07-02 DIAGNOSIS — M25662 Stiffness of left knee, not elsewhere classified: Secondary | ICD-10-CM

## 2015-07-02 DIAGNOSIS — M25562 Pain in left knee: Secondary | ICD-10-CM

## 2015-07-02 DIAGNOSIS — R2689 Other abnormalities of gait and mobility: Secondary | ICD-10-CM

## 2015-07-02 NOTE — Therapy (Signed)
[facility] [address] [place], [place], [address] Phone: [phone]   Fax:  [phone]  Physical Therapy Treatment  Patient Details  Name: Jodi Gray MRN: [id number] Date of Birth: 22-Mar-1970 Referring Provider: Fuller Canada  Encounter Date: 07/02/2015      PT End of Session - 07/02/15 0935    Visit Number 9   Number of Visits 18   Date for PT Re-Evaluation 07/07/15   Authorization Type BCBS   Authorization Time Period 06/16/2015 - 07/28/2015   Authorization - Visit Number 9   Authorization - Number of Visits 18   PT Start Time 0902   PT Stop Time 0945   PT Time Calculation (min) 43 min   Activity Tolerance Patient tolerated treatment well   Behavior During Therapy [facility] for tasks assessed/performed      Past Medical History  Diagnosis Date  . Arthritis   . Hypertension     Past Surgical History  Procedure Laterality Date  . Knee surgery      right and left.  . Tubal ligation    . Knee arthroscopy with medial menisectomy Left 06/11/2015    Procedure: KNEE ARTHROSCOPY WITH MEDIAL MENISECTOMY;  Surgeon: Vickki Hearing, MD;  Location: [facility];  Service: Orthopedics;  Laterality: Left;    There were no vitals filed for this visit.      Subjective Assessment - 07/02/15 0915    Subjective Pt states that she is not having any pain today she is having more stiffness    How long can you sit comfortably? 3 hours    How long can you stand comfortably? without any assistive device 10"   How long can you walk comfortably? 5 minutes without any assisitve device    Currently in Pain? No/denies                         [facility] Adult PT Treatment/Exercise - 07/02/15 0001    Ambulation/Gait   Ambulation/Gait Yes   Ambulation/Gait Assistance 7: Independent   Ambulation Distance (Feet) 300 Feet   Gait Comments heel toe gait with improved velocity    Knee/Hip Exercises: Stretches   Knee: Self-Stretch to  increase Flexion 3 reps;30 seconds   Gastroc Stretch Both;3 reps;30 seconds   Gastroc Stretch Limitations gastroc on slantboard    Other Knee/Hip Stretches back extension to improve posture    Knee/Hip Exercises: Standing   Heel Raises 15 reps   Knee Flexion Left;10 reps   Knee Flexion Limitations 4   Forward Lunges Left;10 reps   Forward Lunges Limitations 2" UE flexion with lunge    Side Lunges 10 reps   Side Lunges Limitations 2# with elbow extension    Terminal Knee Extension Limitations 15 ball on wall    Lateral Step Up 10 reps;Step Height: 6";Hand Hold: 1   Forward Step Up Step Height: 6";Hand Hold: 1   Step Down Step Height: 6";Hand Hold: 1   Functional Squat 15 reps   SLS with Vectors 3 x 15"                   PT Short Term Goals - 07/02/15 0945    PT SHORT TERM GOAL #1   Title Pt will c/o no more than 1/10 pain in L knee during all weight bearing activities.    Time 3   Period Weeks   Status Partially Met   PT SHORT TERM GOAL #2  Title Pt will demonstrate increased AROM of L knee flexin to 90*, and increased L knee extension to 5* to improve gait mechanics.   Time 3   Period Weeks   Status Achieved   PT SHORT TERM GOAL #3   Title Pt will demonstrate abiltiy to weight bear equally through B LE's during gait with improved posture, heel strike, and toe-off to improve balance during functional gait.    Time 3   Period Weeks   Status On-going   PT SHORT TERM GOAL #4   Title Pt will increase muscle strenth in all deficient areas of the hip and knee by 1 MMT  grade to improve balance and gait mechanics.    Time 3   Status On-going   PT SHORT TERM GOAL #5   Title Pt will be independent in with correctly and consistently performing HEP to improve strength required for functional mobiltiy tasks.    Time 3   Period Weeks   Status On-going           PT Long Term Goals - 07/02/15 0945    PT LONG TERM GOAL #1   Title Pt will express pain of 0/10 in L knee  during all functional weight bearing activities to improve QOL and return to leisure activities.    Time 6   Period Weeks   Status On-going   PT LONG TERM GOAL #2   Title Pt will demonstrate improvement with L knee AROM flexion to 110*, and extension to 0* to improve mechanics of the joint during gait.    Time 6   Period Weeks   Status On-going   PT LONG TERM GOAL #3   Title Pt will report ambulating >1070f over even and uneven surfaces with no LOB and good gait mechanics to promote safe community ambulation.   Time 6   Period Weeks   Status On-going   PT LONG TERM GOAL #4   Title Pt will demonstrate improvement of 2 MMT in all deficent muscle groups of the LEs to promote improved balance and gait.    Time 6   Period Weeks   Status On-going   PT LONG TERM GOAL #5   Title Pt will report being able to return to 20 min of exercise on elliptical or water aerobics 2-3 days/wk with no more than 1/10 pain during or after activity.    Time 6   Period Weeks   Status On-going               Plan - 07/02/15 01610   Clinical Impression Statement Pt to department bent forward at the waist and limping.  First of session concentrated on proper gait mechanics.  Therapist facilitated all exercises for proper form.    Rehab Potential Excellent   PT Frequency 3x / week   PT Duration 6 weeks   PT Treatment/Interventions ADLs/Self Care Home Management;Cryotherapy;Electrical Stimulation;Iontophoresis '4mg'$ /ml Dexamethasone;DME Instruction;Gait training;Stair training;Functional mobility training;Therapeutic activities;Therapeutic exercise;Balance training;Neuromuscular re-education;Patient/family education;Manual techniques;Scar mobilization;Passive range of motion;Taping   PT Next Visit Plan Complete manual PRN  to decrease edema.  Complete progress note in 2 more sessions as has MD appt following.    Consulted and Agree with Plan of Care Patient      Patient will benefit from skilled  therapeutic intervention in order to improve the following deficits and impairments:  Abnormal gait, Decreased activity tolerance, Decreased balance, Decreased endurance, Decreased knowledge of use of DME, Decreased range of motion, Decreased skin integrity, Decreased  strength, Difficulty walking, Hypomobility, Increased edema, Impaired flexibility  Visit Diagnosis: Pain in left knee  Other abnormalities of gait and mobility  Stiffness of left knee, not elsewhere classified     Problem List Patient Active Problem List   Diagnosis Date Noted  . Acute medial meniscus tear of left knee     Rayetta Humphrey, PT CLT [phone] 07/02/2015, 9:46 AM  [place], [place], [address] Phone: [phone]   Fax:  [phone]  Name: Jodi Gray MRN: [id number] Date of Birth: 10/10/1970

## 2015-07-06 ENCOUNTER — Ambulatory Visit (INDEPENDENT_AMBULATORY_CARE_PROVIDER_SITE_OTHER): Payer: BLUE CROSS/BLUE SHIELD | Admitting: Orthopedic Surgery

## 2015-07-06 ENCOUNTER — Encounter: Payer: Self-pay | Admitting: Orthopedic Surgery

## 2015-07-06 ENCOUNTER — Ambulatory Visit (HOSPITAL_COMMUNITY): Payer: BLUE CROSS/BLUE SHIELD | Admitting: Physical Therapy

## 2015-07-06 VITALS — BP 150/102 | HR 79 | Ht 67.0 in | Wt 177.0 lb

## 2015-07-06 DIAGNOSIS — M25662 Stiffness of left knee, not elsewhere classified: Secondary | ICD-10-CM

## 2015-07-06 DIAGNOSIS — R2689 Other abnormalities of gait and mobility: Secondary | ICD-10-CM

## 2015-07-06 DIAGNOSIS — Z4789 Encounter for other orthopedic aftercare: Secondary | ICD-10-CM

## 2015-07-06 DIAGNOSIS — M25562 Pain in left knee: Secondary | ICD-10-CM

## 2015-07-06 DIAGNOSIS — Z9889 Other specified postprocedural states: Secondary | ICD-10-CM

## 2015-07-06 MED ORDER — HYDROCODONE-ACETAMINOPHEN 7.5-325 MG PO TABS: 1.0000 | ORAL_TABLET | Freq: Three times a day (TID) | ORAL | Status: DC | PRN

## 2015-07-06 NOTE — Patient Instructions (Signed)
RETURN TO WORK NOTE 07/18/15

## 2015-07-06 NOTE — Therapy (Signed)
[place], [place], [address] Phone: [phone]   Fax:  [phone]  Physical Therapy Treatment  Patient Details  Name: Jodi Gray MRN: [id number] Date of Birth: [birth year] Referring Provider: Aline Brochure  Encounter Date: 07/06/2015      PT End of Session - 07/06/15 1039    Visit Number 10   Number of Visits 18   Date for PT Re-Evaluation 07/07/15   Authorization Type BCBS   Authorization Time Period 06/16/2015 - 07/28/2015   Authorization - Visit Number 10   Authorization - Number of Visits 18   PT Start Time 9518   PT Stop Time 1120   PT Time Calculation (min) 40 min   Activity Tolerance Patient tolerated treatment well   Behavior During Therapy [facility] for tasks assessed/performed      Past Medical History  Diagnosis Date  . Arthritis   . Hypertension     Past Surgical History  Procedure Laterality Date  . Knee surgery      right and left.  . Tubal ligation    . Knee arthroscopy with medial menisectomy Left 06/11/2015    Procedure: KNEE ARTHROSCOPY WITH MEDIAL MENISECTOMY;  Surgeon: Carole Civil, MD;  Location: [facility];  Service: Orthopedics;  Laterality: Left;    There were no vitals filed for this visit.      Subjective Assessment - 07/06/15 1117    Subjective Pt comes today without AD and having pain of 2/10.  Went shopping on Saturday and states she used her crutch most of the day with increased pain to 8/10.            [facility] PT Assessment - 07/06/15 0001    Assessment   Medical Diagnosis L knee scope    Referring Provider Aline Brochure   Onset Date/Surgical Date 06/11/15   Next MD Visit 07/06/2015   AROM   Left Knee Extension 4   Left Knee Flexion 108   PROM   Left Knee Flexion 112   Strength   Right Hip Flexion 5/5  was 3/5   Right Hip Extension 4/5  was 3+/5   Right Hip ABduction 4+/5  was 4/5   Left Hip Flexion 5/5  was 3/5   Left Hip Extension 4/5   Left Hip  ABduction 4/5  was 3/5   Right Knee Flexion 5/5  was 5/5   Right Knee Extension 5/5  was 3+/5   Left Knee Flexion 4+/5  was 3/5   Left Knee Extension 4+/5  was 3+/5                               PT Short Term Goals - 07/06/15 1105    PT SHORT TERM GOAL #1   Title Pt will c/o no more than 1/10 pain in L knee during all weight bearing activities.    Time 3   Period Weeks   Status Partially Met  goes higher with longer duration ambulation   PT SHORT TERM GOAL #2   Title Pt will demonstrate increased AROM of L knee flexin to 90*, and increased L knee extension to 5* to improve gait mechanics.   Time 3   Period Weeks   Status Achieved   PT SHORT TERM GOAL #3   Title Pt will demonstrate abiltiy to weight bear equally through B LE's during gait with improved posture, heel strike, and toe-off to improve  balance during functional gait.    Time 3   Period Weeks   Status Achieved   PT SHORT TERM GOAL #4   Title Pt will increase muscle strenth in all deficient areas of the hip and knee by 1 MMT  grade to improve balance and gait mechanics.    Time 3   Status Partially Met   PT SHORT TERM GOAL #5   Title Pt will be independent in with correctly and consistently performing HEP to improve strength required for functional mobiltiy tasks.    Time 3   Period Weeks   Status Achieved           PT Long Term Goals - 07/06/15 1107    PT LONG TERM GOAL #1   Title Pt will express pain of 0/10 in L knee during all functional weight bearing activities to improve QOL and return to leisure activities.    Time 6   Period Weeks   Status On-going   PT LONG TERM GOAL #2   Title Pt will demonstrate improvement with L knee AROM flexion to 110*, and extension to 0* to improve mechanics of the joint during gait.    Time 6   Period Weeks   Status On-going   PT LONG TERM GOAL #3   Title Pt will report ambulating >1035f over even and uneven surfaces with no LOB and good gait  mechanics to promote safe community ambulation.   Time 6   Period Weeks   Status Partially Met   PT LONG TERM GOAL #4   Title Pt will demonstrate improvement of 2 MMT in all deficent muscle groups of the LEs to promote improved balance and gait.    Time 6   Period Weeks   Status On-going   PT LONG TERM GOAL #5   Title Pt will report being able to return to 20 min of exercise on elliptical or water aerobics 2-3 days/wk with no more than 1/10 pain during or after activity.    Time 6   Period Weeks   Status On-going               Plan - 07/06/15 1111    Clinical Impression Statement Pt with improving posture and decreased antalgia with gait.  Completed 3 minute walk test with competion of 450 feet.  All B LE muscle groups have increased 1/2-1 grade and Lt LE ROM increased 4-108 degrees (was 8-74).  Pt instructed in scar massage as well. Pt still with remaining deficits and goals unmet and would benefit from contniued therapy to work on these.    Rehab Potential Excellent   PT Frequency 3x / week   PT Duration 6 weeks   PT Treatment/Interventions ADLs/Self Care Home Management;Cryotherapy;Electrical Stimulation;Iontophoresis '4mg'$ /ml Dexamethasone;DME Instruction;Gait training;Stair training;Functional mobility training;Therapeutic activities;Therapeutic exercise;Balance training;Neuromuscular re-education;Patient/family education;Manual techniques;Scar mobilization;Passive range of motion;Taping   PT Next Visit Plan Continue remaining sessions with focus on functional strengthening, gait and ROM.    Consulted and Agree with Plan of Care Patient      Patient will benefit from skilled therapeutic intervention in order to improve the following deficits and impairments:  Abnormal gait, Decreased activity tolerance, Decreased balance, Decreased endurance, Decreased knowledge of use of DME, Decreased range of motion, Decreased skin integrity, Decreased strength, Difficulty walking,  Hypomobility, Increased edema, Impaired flexibility  Visit Diagnosis: Pain in left knee  Other abnormalities of gait and mobility  Stiffness of left knee, not elsewhere classified     Problem List  Patient Active Problem List   Diagnosis Date Noted  . Acute medial meniscus tear of left knee     Teena Irani, PTA/CLT [phone]  07/06/2015, 11:17 AM  [place], [place], [address] Phone: [phone]   Fax:  [phone]  Name: Jodi Gray MRN: [id number] Date of Birth: August 06, 1970

## 2015-07-06 NOTE — Progress Notes (Signed)
Chief Complaint  Patient presents with  . Follow-up    3 wek recheck on on left knee, SALK, DOS 06-11-15.    Postop visit 3 weeks post arthroscopy left knee medial meniscectomy  Patient making good progress has regained most of her flexion seems to have about 5 of extension PT notes indicate improved overall function  I think she'll be ready to go back by July 7  I'll see her in 3 weeks she should continue all of her exercises.  She should continue physical therapy

## 2015-07-08 ENCOUNTER — Ambulatory Visit (HOSPITAL_COMMUNITY): Payer: BLUE CROSS/BLUE SHIELD | Admitting: Physical Therapy

## 2015-07-08 DIAGNOSIS — M25662 Stiffness of left knee, not elsewhere classified: Secondary | ICD-10-CM

## 2015-07-08 DIAGNOSIS — R2689 Other abnormalities of gait and mobility: Secondary | ICD-10-CM

## 2015-07-08 DIAGNOSIS — M25562 Pain in left knee: Secondary | ICD-10-CM | POA: Diagnosis not present

## 2015-07-08 NOTE — Therapy (Signed)
[place], [place], [address] Phone: [phone]   Fax:  [phone]  Physical Therapy Treatment  Patient Details  Name: Jodi Gray MRN: [id number] Date of Birth: Jun 08, 1970 Referring Provider: Aline Brochure  Encounter Date: 07/08/2015      PT End of Session - 07/08/15 0857    Visit Number 11   Number of Visits 18   Date for PT Re-Evaluation 07/28/15   Authorization Type BCBS   Authorization Time Period 06/16/2015 - 07/28/2015   Authorization - Visit Number 11   Authorization - Number of Visits 18   PT Start Time 0815   PT Stop Time 0856   PT Time Calculation (min) 41 min   Activity Tolerance Patient tolerated treatment well   Behavior During Therapy [facility] for tasks assessed/performed      Past Medical History  Diagnosis Date  . Arthritis   . Hypertension     Past Surgical History  Procedure Laterality Date  . Knee surgery      right and left.  . Tubal ligation    . Knee arthroscopy with medial menisectomy Left 06/11/2015    Procedure: KNEE ARTHROSCOPY WITH MEDIAL MENISECTOMY;  Surgeon: Carole Civil, MD;  Location: [facility];  Service: Orthopedics;  Laterality: Left;    There were no vitals filed for this visit.      Subjective Assessment - 07/08/15 0819    Subjective Patient arrives today reporting that she is having more pain in her knee this morning than usual, maybe because she was up on her feet quite a bit the other day    Pertinent History HTN & plantar faschitis   Currently in Pain? Yes   Pain Score 5    Pain Location Knee   Pain Orientation Left   Pain Descriptors / Indicators Aching;Throbbing   Pain Type Surgical pain   Pain Radiating Towards going down into lower LE a little bit    Pain Onset 1 to 4 weeks ago   Pain Frequency Constant   Aggravating Factors  weight bearing    Pain Relieving Factors ice    Effect of Pain on Daily Activities increases pain                           [facility] Adult PT Treatment/Exercise - 07/08/15 0001    Knee/Hip Exercises: Stretches   Active Hamstring Stretch Both;3 reps;30 seconds   Active Hamstring Stretch Limitations on stairs    Knee: Self-Stretch to increase Flexion 3 reps;30 seconds   Gastroc Stretch Both;3 reps;30 seconds   Gastroc Stretch Limitations gastroc on slantboard    Knee/Hip Exercises: Standing   Heel Raises 20 reps   Heel Raises Limitations heel and toe    Knee Flexion Left;10 reps   Knee Flexion Limitations 4   Forward Lunges Both;1 set;10 reps   Forward Lunges Limitations 2 inch box    Side Lunges Both;1 set;10 reps   Side Lunges Limitations 4 inch step    Lateral Step Up Both;1 set;10 reps   Lateral Step Up Limitations 6 inch box    Forward Step Up Both;1 set;10 reps   Forward Step Up Limitations 6 inch box    Rocker Board 2 minutes   Rocker Board Limitations lateral and [facility]    Knee/Hip Exercises: Seated   Long Arc Quad Left;1 set;15 reps   Long Arc Quad Weight 3 lbs.   Manual Therapy  Manual Therapy Edema management   Manual therapy comments performed separtely of all other skilled PT services    Edema Management retrograde massage with LE elevated                 PT Education - 07/08/15 0857    Education provided Yes   Education Details make sure she is staying on top of icing at home    Person(s) Educated Patient   Methods Explanation   Comprehension Verbalized understanding          PT Short Term Goals - 07/06/15 1105    PT SHORT TERM GOAL #1   Title Pt will c/o no more than 1/10 pain in L knee during all weight bearing activities.    Time 3   Period Weeks   Status Partially Met  goes higher with longer duration ambulation   PT SHORT TERM GOAL #2   Title Pt will demonstrate increased AROM of L knee flexin to 90*, and increased L knee extension to 5* to improve gait mechanics.   Time 3   Period Weeks   Status Achieved   PT SHORT TERM  GOAL #3   Title Pt will demonstrate abiltiy to weight bear equally through B LE's during gait with improved posture, heel strike, and toe-off to improve balance during functional gait.    Time 3   Period Weeks   Status Achieved   PT SHORT TERM GOAL #4   Title Pt will increase muscle strenth in all deficient areas of the hip and knee by 1 MMT  grade to improve balance and gait mechanics.    Time 3   Status Partially Met   PT SHORT TERM GOAL #5   Title Pt will be independent in with correctly and consistently performing HEP to improve strength required for functional mobiltiy tasks.    Time 3   Period Weeks   Status Achieved           PT Long Term Goals - 07/06/15 1107    PT LONG TERM GOAL #1   Title Pt will express pain of 0/10 in L knee during all functional weight bearing activities to improve QOL and return to leisure activities.    Time 6   Period Weeks   Status On-going   PT LONG TERM GOAL #2   Title Pt will demonstrate improvement with L knee AROM flexion to 110*, and extension to 0* to improve mechanics of the joint during gait.    Time 6   Period Weeks   Status On-going   PT LONG TERM GOAL #3   Title Pt will report ambulating >1085f over even and uneven surfaces with no LOB and good gait mechanics to promote safe community ambulation.   Time 6   Period Weeks   Status Partially Met   PT LONG TERM GOAL #4   Title Pt will demonstrate improvement of 2 MMT in all deficent muscle groups of the LEs to promote improved balance and gait.    Time 6   Period Weeks   Status On-going   PT LONG TERM GOAL #5   Title Pt will report being able to return to 20 min of exercise on elliptical or water aerobics 2-3 days/wk with no more than 1/10 pain during or after activity.    Time 6   Period Weeks   Status On-going               Plan - 07/08/15 02103  Clinical Impression Statement Continued focus on functional strengthening today within patient tolerance, as she did  report increased pain medial knee after being on her feet a lot the other day. Patient did require cues for form occasionally during exercises for form. She did have increased pain today during exercises, so activities and tasks were modified today as needed to address this. Performed edema massage at end of session with significant reduction in pain noted after massage- suspect that pain today was likely due to fluid buildup.    Rehab Potential Excellent   PT Frequency 3x / week   PT Duration 6 weeks   PT Treatment/Interventions ADLs/Self Care Home Management;Cryotherapy;Electrical Stimulation;Iontophoresis 37m/ml Dexamethasone;DME Instruction;Gait training;Stair training;Functional mobility training;Therapeutic activities;Therapeutic exercise;Balance training;Neuromuscular re-education;Patient/family education;Manual techniques;Scar mobilization;Passive range of motion;Taping   PT Next Visit Plan Continue remaining sessions with focus on functional strengthening, gait and ROM.    Consulted and Agree with Plan of Care Patient      Patient will benefit from skilled therapeutic intervention in order to improve the following deficits and impairments:  Abnormal gait, Decreased activity tolerance, Decreased balance, Decreased endurance, Decreased knowledge of use of DME, Decreased range of motion, Decreased skin integrity, Decreased strength, Difficulty walking, Hypomobility, Increased edema, Impaired flexibility  Visit Diagnosis: Pain in left knee  Other abnormalities of gait and mobility  Stiffness of left knee, not elsewhere classified     Problem List Patient Active Problem List   Diagnosis Date Noted  . Acute medial meniscus tear of left knee     KDeniece ReePT, DPT [id number]STeh[address]h [place] [address]Phone: [phone]  Fax:  [phone] Name: RLATONDRA GEBHARTMRN: [id number] of Birth:  [birth year]

## 2015-07-10 ENCOUNTER — Ambulatory Visit (HOSPITAL_COMMUNITY): Payer: BLUE CROSS/BLUE SHIELD

## 2015-07-10 DIAGNOSIS — M25562 Pain in left knee: Secondary | ICD-10-CM

## 2015-07-10 DIAGNOSIS — R2689 Other abnormalities of gait and mobility: Secondary | ICD-10-CM

## 2015-07-10 DIAGNOSIS — M25662 Stiffness of left knee, not elsewhere classified: Secondary | ICD-10-CM

## 2015-07-10 NOTE — Therapy (Signed)
[place], [place], [address] Phone: [phone]   Fax:  [phone]  Physical Therapy Treatment  Patient Details  Name: Jodi Gray MRN: [id number] Date of Birth: 23-Jun-1970 Referring Provider: Aline Brochure  Encounter Date: 07/10/2015      PT End of Session - 07/10/15 1811    Visit Number 12   Number of Visits 18   Date for PT Re-Evaluation 07/28/15   Authorization Type BCBS   Authorization Time Period 06/16/2015 - 07/28/2015   Authorization - Visit Number 12   Authorization - Number of Visits 18   PT Start Time 3016   PT Stop Time 1820   PT Time Calculation (min) 45 min   Activity Tolerance Patient tolerated treatment well   Behavior During Therapy [facility] for tasks assessed/performed      Past Medical History  Diagnosis Date  . Arthritis   . Hypertension     Past Surgical History  Procedure Laterality Date  . Knee surgery      right and left.  . Tubal ligation    . Knee arthroscopy with medial menisectomy Left 06/11/2015    Procedure: KNEE ARTHROSCOPY WITH MEDIAL MENISECTOMY;  Surgeon: Carole Civil, MD;  Location: [facility];  Service: Orthopedics;  Laterality: Left;    There were no vitals filed for this visit.      Subjective Assessment - 07/10/15 1740    Subjective Pt reports minimal pain intermittent 1/10.  Main problems with stairs, strengthening and straightening knee. Reports compliance wiht HEP and iceing   Pertinent History HTN & plantar faschitis   Patient Stated Goals Hope to get better range, decreae pain, decrease swelling, and go back to normal activities and exercise.  Water aerobics - nothing with any impact.    Currently in Pain? Yes   Pain Score 1                          [facility] Adult PT Treatment/Exercise - 07/10/15 0001    Knee/Hip Exercises: Stretches   Active Hamstring Stretch Both;3 reps;30 seconds   Active Hamstring Stretch Limitations 12in step    Knee: Self-Stretch to increase Flexion 3 reps;30 seconds   Gastroc Stretch Both;3 reps;30 seconds   Gastroc Stretch Limitations gastroc on slantboard    Other Knee/Hip Stretches back extension to improve posture    Knee/Hip Exercises: Standing   Heel Raises 20 reps   Heel Raises Limitations heel and toe    Terminal Knee Extension Limitations 15x5" with GTB   Lateral Step Up Left;15 reps;Hand Hold: 2;Step Height: 6"  with band to reduce genu valgus   Lateral Step Up Limitations with band to reduce genu valgus   Forward Step Up Left;15 reps;Hand Hold: 1;Step Height: 6"   Forward Step Up Limitations with band to reduce genu valgus   Functional Squat 15 reps   Functional Squat Limitations cueing for appropriate form   SLS with Vectors 3 x 15"   with band to reduce genu valgus   Gait Training Cueing to reduce genu valgus with gait   Other Standing Knee Exercises sidestep wtih RTB                  PT Short Term Goals - 07/06/15 1105    PT SHORT TERM GOAL #1   Title Pt will c/o no more than 1/10 pain in L knee during all weight bearing activities.    Time 3  Period Weeks   Status Partially Met  goes higher with longer duration ambulation   PT SHORT TERM GOAL #2   Title Pt will demonstrate increased AROM of L knee flexin to 90*, and increased L knee extension to 5* to improve gait mechanics.   Time 3   Period Weeks   Status Achieved   PT SHORT TERM GOAL #3   Title Pt will demonstrate abiltiy to weight bear equally through B LE's during gait with improved posture, heel strike, and toe-off to improve balance during functional gait.    Time 3   Period Weeks   Status Achieved   PT SHORT TERM GOAL #4   Title Pt will increase muscle strenth in all deficient areas of the hip and knee by 1 MMT  grade to improve balance and gait mechanics.    Time 3   Status Partially Met   PT SHORT TERM GOAL #5   Title Pt will be independent in with correctly and consistently performing HEP to  improve strength required for functional mobiltiy tasks.    Time 3   Period Weeks   Status Achieved           PT Long Term Goals - 07/06/15 1107    PT LONG TERM GOAL #1   Title Pt will express pain of 0/10 in L knee during all functional weight bearing activities to improve QOL and return to leisure activities.    Time 6   Period Weeks   Status On-going   PT LONG TERM GOAL #2   Title Pt will demonstrate improvement with L knee AROM flexion to 110*, and extension to 0* to improve mechanics of the joint during gait.    Time 6   Period Weeks   Status On-going   PT LONG TERM GOAL #3   Title Pt will report ambulating >1091f over even and uneven surfaces with no LOB and good gait mechanics to promote safe community ambulation.   Time 6   Period Weeks   Status Partially Met   PT LONG TERM GOAL #4   Title Pt will demonstrate improvement of 2 MMT in all deficent muscle groups of the LEs to promote improved balance and gait.    Time 6   Period Weeks   Status On-going   PT LONG TERM GOAL #5   Title Pt will report being able to return to 20 min of exercise on elliptical or water aerobics 2-3 days/wk with no more than 1/10 pain during or after activity.    Time 6   Period Weeks   Status On-going               Plan - 07/10/15 1905    Clinical Impression Statement Session focus on improving functional strengthening with cueing to improve mechanics with gait and therex.  Pt ambulates with genu valgus and c/o sharp pain on medial aspect of knee especialy with lateral step up training.  Added band to reduce genu valgus and visual cueing to assist with form with reports of sharp pain resolved with lateral step ups.  Added band with forward step ups and vector stance.  Began sidestepping with RTB for glut med strengthening.  Pt required cueing for posture through session to reduce forward trunk to reduce stress on anterior knee with therex and gait with reports of pain reduced.     Rehab  Potential Excellent   PT Frequency 3x / week   PT Duration 6 weeks   PT  Treatment/Interventions ADLs/Self Care Home Management;Cryotherapy;Electrical Stimulation;Iontophoresis '4mg'$ /ml Dexamethasone;DME Instruction;Gait training;Stair training;Functional mobility training;Therapeutic activities;Therapeutic exercise;Balance training;Neuromuscular re-education;Patient/family education;Manual techniques;Scar mobilization;Passive range of motion;Taping   PT Next Visit Plan Continue remaining sessions with focus on functional strengthening, gait and ROM. Use band to reduce genu valgus to improve form with therex PRN.      Patient will benefit from skilled therapeutic intervention in order to improve the following deficits and impairments:  Abnormal gait, Decreased activity tolerance, Decreased balance, Decreased endurance, Decreased knowledge of use of DME, Decreased range of motion, Decreased skin integrity, Decreased strength, Difficulty walking, Hypomobility, Increased edema, Impaired flexibility  Visit Diagnosis: Pain in left knee  Other abnormalities of gait and mobility  Stiffness of left knee, not elsewhere classified     Problem List Patient Active Problem List   Diagnosis Date Noted  . Acute medial meniscus tear of left knee    Jodi Gray, LPTA; [place]  Aldona Lento 07/10/2015, 7:10 PM  [place], [place], [address] Phone: [phone]   Fax:  [phone]  Name: NIAOMI CARTAYA MRN: [id number] Date of Birth: 28-Dec-1970

## 2015-07-13 ENCOUNTER — Ambulatory Visit (HOSPITAL_COMMUNITY): Payer: BLUE CROSS/BLUE SHIELD | Attending: Orthopedic Surgery | Admitting: Physical Therapy

## 2015-07-13 ENCOUNTER — Telehealth (HOSPITAL_COMMUNITY): Payer: Self-pay | Admitting: Physical Therapy

## 2015-07-13 DIAGNOSIS — R2689 Other abnormalities of gait and mobility: Secondary | ICD-10-CM | POA: Insufficient documentation

## 2015-07-13 DIAGNOSIS — M25662 Stiffness of left knee, not elsewhere classified: Secondary | ICD-10-CM | POA: Insufficient documentation

## 2015-07-13 DIAGNOSIS — M25562 Pain in left knee: Secondary | ICD-10-CM | POA: Insufficient documentation

## 2015-07-13 NOTE — Telephone Encounter (Signed)
Pt did not show for appointment.  Called and left message remininding of next appointment wednesday at 5:30 Lurena Nidamy B Adayah Arocho, PTA/CLT [phone]

## 2015-07-15 ENCOUNTER — Ambulatory Visit (HOSPITAL_COMMUNITY): Payer: BLUE CROSS/BLUE SHIELD

## 2015-07-15 DIAGNOSIS — M25662 Stiffness of left knee, not elsewhere classified: Secondary | ICD-10-CM | POA: Diagnosis present

## 2015-07-15 DIAGNOSIS — M25562 Pain in left knee: Secondary | ICD-10-CM

## 2015-07-15 DIAGNOSIS — R2689 Other abnormalities of gait and mobility: Secondary | ICD-10-CM

## 2015-07-15 NOTE — Therapy (Signed)
[place] [facility] [address] [place], [place], [address] Phone: [phone]   Fax:  [phone]  Physical Therapy Treatment  Patient Details  Name: Jodi Gray MRN: [id number] Date of Birth: [birth year] Referring Provider: Romeo Apple  Encounter Date: 07/15/2015      PT End of Session - 07/15/15 1737    Visit Number 13   Number of Visits 18   Date for PT Re-Evaluation 07/28/15   Authorization Type BCBS   Authorization Time Period 06/16/2015 - 07/28/2015   Authorization - Visit Number 13   Authorization - Number of Visits 18   PT Start Time 1733   PT Stop Time 1816   PT Time Calculation (min) 43 min   Activity Tolerance Patient tolerated treatment well   Behavior During Therapy [facility] for tasks assessed/performed      Past Medical History  Diagnosis Date  . Arthritis   . Hypertension     Past Surgical History  Procedure Laterality Date  . Knee surgery      right and left.  . Tubal ligation    . Knee arthroscopy with medial menisectomy Left 06/11/2015    Procedure: KNEE ARTHROSCOPY WITH MEDIAL MENISECTOMY;  Surgeon: Vickki Hearing, MD;  Location: [facility];  Service: Orthopedics;  Laterality: Left;    There were no vitals filed for this visit.      Subjective Assessment - 07/15/15 1735    Subjective Pt continues to have minimal pain medial aspect of Lt knee pain scale 1/10.  Pt reports she returns to work next Monday 07/20/2015   Pertinent History HTN & plantar faschitis   Patient Stated Goals Hope to get better range, decreae pain, decrease swelling, and go back to normal activities and exercise.  Water aerobics - nothing with any impact.    Currently in Pain? Yes   Pain Score 1    Pain Location Knee   Pain Orientation Left   Pain Descriptors / Indicators Aching   Pain Type Surgical pain   Pain Radiating Towards going down into lower LE a little bit   Pain Onset 1 to 4 weeks ago   Pain Frequency Constant   Aggravating  Factors  weight bearing   Pain Relieving Factors ice   Effect of Pain on Daily Activities increases pain   Multiple Pain Sites No            [facility] PT Assessment - 07/15/15 0001    Assessment   Medical Diagnosis L knee scope    Referring Provider Romeo Apple   Onset Date/Surgical Date 06/11/15   Next MD Visit 07/17   Prior Therapy Yes for Plantar fascitis              [facility] Adult PT Treatment/Exercise - 07/15/15 0001    Knee/Hip Exercises: Stretches   Active Hamstring Stretch Both;3 reps;30 seconds   Active Hamstring Stretch Limitations 12in step   Knee: Self-Stretch Limitations knee drives 10x 10"   Gastroc Stretch Both;3 reps;30 seconds   Gastroc Stretch Limitations gastroc on slantboard    Other Knee/Hip Stretches back extension to improve posture    Knee/Hip Exercises: Aerobic   Elliptical 4' L1    Knee/Hip Exercises: Standing   Heel Raises 20 reps   Heel Raises Limitations heel and toe    Lateral Step Up Left;15 reps;Hand Hold: 2;Step Height: 6"   Lateral Step Up Limitations with band to reduce genu valgus   Forward Step Up Left;15 reps;Hand Hold: 1;Step Height: 6"  Forward Step Up Limitations with band to reduce genu valgus   Step Down Step Height: 6";Hand Hold: 1;15 reps   Functional Squat 15 reps   Functional Squat Limitations cueing for appropriate weight bearing   SLS with Vectors 3 x 15"    Gait Training Cueing to reduce genu valgus with gait   Other Standing Knee Exercises sidestep wtih RTB                  PT Short Term Goals - 07/06/15 1105    PT SHORT TERM GOAL #1   Title Pt will c/o no more than 1/10 pain in L knee during all weight bearing activities.    Time 3   Period Weeks   Status Partially Met  goes higher with longer duration ambulation   PT SHORT TERM GOAL #2   Title Pt will demonstrate increased AROM of L knee flexin to 90*, and increased L knee extension to 5* to improve gait mechanics.   Time 3   Period Weeks   Status  Achieved   PT SHORT TERM GOAL #3   Title Pt will demonstrate abiltiy to weight bear equally through B LE's during gait with improved posture, heel strike, and toe-off to improve balance during functional gait.    Time 3   Period Weeks   Status Achieved   PT SHORT TERM GOAL #4   Title Pt will increase muscle strenth in all deficient areas of the hip and knee by 1 MMT  grade to improve balance and gait mechanics.    Time 3   Status Partially Met   PT SHORT TERM GOAL #5   Title Pt will be independent in with correctly and consistently performing HEP to improve strength required for functional mobiltiy tasks.    Time 3   Period Weeks   Status Achieved           PT Long Term Goals - 07/06/15 1107    PT LONG TERM GOAL #1   Title Pt will express pain of 0/10 in L knee during all functional weight bearing activities to improve QOL and return to leisure activities.    Time 6   Period Weeks   Status On-going   PT LONG TERM GOAL #2   Title Pt will demonstrate improvement with L knee AROM flexion to 110*, and extension to 0* to improve mechanics of the joint during gait.    Time 6   Period Weeks   Status On-going   PT LONG TERM GOAL #3   Title Pt will report ambulating >1040f over even and uneven surfaces with no LOB and good gait mechanics to promote safe community ambulation.   Time 6   Period Weeks   Status Partially Met   PT LONG TERM GOAL #4   Title Pt will demonstrate improvement of 2 MMT in all deficent muscle groups of the LEs to promote improved balance and gait.    Time 6   Period Weeks   Status On-going   PT LONG TERM GOAL #5   Title Pt will report being able to return to 20 min of exercise on elliptical or water aerobics 2-3 days/wk with no more than 1/10 pain during or after activity.    Time 6   Period Weeks   Status On-going               Plan - 07/15/15 1818    Clinical Impression Statement Session focus on improving mechanics with functional  strengthening therex.  Continued with theraband assistance to reduce genu valgus with no reports of sharp pain this session.  Increased height with step down and began stair training, noted increased difficutly with Rt LE eccentric control and increased rotation as compensation.  Therapist facilitation to improve form and posture wtih therex through session.     Rehab Potential Excellent   PT Frequency 3x / week   PT Duration 6 weeks   PT Treatment/Interventions ADLs/Self Care Home Management;Cryotherapy;Electrical Stimulation;Iontophoresis '4mg'$ /ml Dexamethasone;DME Instruction;Gait training;Stair training;Functional mobility training;Therapeutic activities;Therapeutic exercise;Balance training;Neuromuscular re-education;Patient/family education;Manual techniques;Scar mobilization;Passive range of motion;Taping   PT Next Visit Plan Continue remaining sessions with focus on functional strengthening, gait and ROM. Use band to reduce genu valgus to improve form with therex PRN.      Patient will benefit from skilled therapeutic intervention in order to improve the following deficits and impairments:  Abnormal gait, Decreased activity tolerance, Decreased balance, Decreased endurance, Decreased knowledge of use of DME, Decreased range of motion, Decreased skin integrity, Decreased strength, Difficulty walking, Hypomobility, Increased edema, Impaired flexibility  Visit Diagnosis: Pain in left knee  Other abnormalities of gait and mobility  Stiffness of left knee, not elsewhere classified     Problem List Patient Active Problem List   Diagnosis Date Noted  . Acute medial meniscus tear of left knee    Ihor Austin, LPTA; [place]  Aldona Lento 07/15/2015, 6:23 PM  [place], [place], [address] Phone: [phone]   Fax:  [phone]  Name: ESMERALDA MALAY MRN: [id number] Date of Birth:  October 08, 1970

## 2015-07-17 ENCOUNTER — Encounter (HOSPITAL_COMMUNITY): Payer: BLUE CROSS/BLUE SHIELD | Admitting: Physical Therapy

## 2015-07-17 ENCOUNTER — Ambulatory Visit (HOSPITAL_COMMUNITY): Payer: BLUE CROSS/BLUE SHIELD

## 2015-07-17 DIAGNOSIS — M25562 Pain in left knee: Secondary | ICD-10-CM | POA: Diagnosis not present

## 2015-07-17 DIAGNOSIS — R2689 Other abnormalities of gait and mobility: Secondary | ICD-10-CM

## 2015-07-17 DIAGNOSIS — M25662 Stiffness of left knee, not elsewhere classified: Secondary | ICD-10-CM

## 2015-07-17 NOTE — Therapy (Signed)
[place], [place], [address] Phone: [phone]   Fax:  [phone]  Physical Therapy Treatment  Patient Details  Name: Jodi Gray MRN: [id number] Date of Birth: [birth year] Referring Provider: Aline Brochure  Encounter Date: 07/17/2015      PT End of Session - 07/17/15 1203    Visit Number 14   Number of Visits 18   Date for PT Re-Evaluation 07/28/15   Authorization Type BCBS   Authorization Time Period 06/16/2015 - 07/28/2015   Authorization - Visit Number 14   Authorization - Number of Visits 18   PT Start Time 2595   PT Stop Time 1113   PT Time Calculation (min) 38 min   Activity Tolerance Patient tolerated treatment well   Behavior During Therapy Jodi Gray for tasks assessed/performed      Past Medical History  Diagnosis Date  . Arthritis   . Hypertension     Past Surgical History  Procedure Laterality Date  . Knee surgery      right and left.  . Tubal ligation    . Knee arthroscopy with medial menisectomy Left 06/11/2015    Procedure: KNEE ARTHROSCOPY WITH MEDIAL MENISECTOMY;  Surgeon: Carole Civil, MD;  Location: [facility];  Service: Orthopedics;  Laterality: Left;    There were no vitals filed for this visit.      Subjective Assessment - 07/17/15 1038    Subjective Pt stated knee feels better today, increased pain following last session while walking in [employer] afterwards.   Pertinent History HTN & plantar faschitis   Patient Stated Goals Hope to get better range, decreae pain, decrease swelling, and go back to normal activities and exercise.  Water aerobics - nothing with any impact.    Currently in Pain? Yes   Pain Score 1    Pain Location Knee   Pain Orientation Left   Pain Type Surgical pain   Pain Radiating Towards going down into lower LE a little bit   Pain Onset 1 to 4 weeks ago   Pain Frequency Constant   Aggravating Factors  weight bearing   Pain Relieving Factors ice   Effect  of Pain on Daily Activities increases pain              [facility] Adult PT Treatment/Exercise - 07/17/15 0001    Knee/Hip Exercises: Standing   Heel Raises 20 reps   Heel Raises Limitations heel and toe    Forward Lunges Both;15 reps   Lateral Step Up Left;15 reps;Step Height: 6";Hand Hold: 1   Lateral Step Up Limitations cueing for correct mechanics, no band used this session   Forward Step Up Left;15 reps;Hand Hold: 1;Step Height: 6"   Forward Step Up Limitations cueing for correct mechanics, no band used this session   Step Down Step Height: 6";Hand Hold: 1;15 reps   Functional Squat 15 reps   Functional Squat Limitations cueing for appropriate weight bearing   Other Standing Knee Exercises sidestep wtih RTB   Manual Therapy   Manual Therapy Myofascial release   Manual therapy comments performed separtely of all other skilled PT services    Myofascial Release scar tissue massage complete and instructed to reduce adhesions on medial incision for pain control.               PT Short Term Goals - 07/06/15 1105    PT SHORT TERM GOAL #1   Title Pt will c/o no more than 1/10 pain in  L knee during all weight bearing activities.    Time 3   Period Weeks   Status Partially Met  goes higher with longer duration ambulation   PT SHORT TERM GOAL #2   Title Pt will demonstrate increased AROM of L knee flexin to 90*, and increased L knee extension to 5* to improve gait mechanics.   Time 3   Period Weeks   Status Achieved   PT SHORT TERM GOAL #3   Title Pt will demonstrate abiltiy to weight bear equally through B LE's during gait with improved posture, heel strike, and toe-off to improve balance during functional gait.    Time 3   Period Weeks   Status Achieved   PT SHORT TERM GOAL #4   Title Pt will increase muscle strenth in all deficient areas of the hip and knee by 1 MMT  grade to improve balance and gait mechanics.    Time 3   Status Partially Met   PT SHORT TERM GOAL #5    Title Pt will be independent in with correctly and consistently performing HEP to improve strength required for functional mobiltiy tasks.    Time 3   Period Weeks   Status Achieved           PT Long Term Goals - 07/06/15 1107    PT LONG TERM GOAL #1   Title Pt will express pain of 0/10 in L knee during all functional weight bearing activities to improve QOL and return to leisure activities.    Time 6   Period Weeks   Status On-going   PT LONG TERM GOAL #2   Title Pt will demonstrate improvement with L knee AROM flexion to 110*, and extension to 0* to improve mechanics of the joint during gait.    Time 6   Period Weeks   Status On-going   PT LONG TERM GOAL #3   Title Pt will report ambulating >1036ft over even and uneven surfaces with no LOB and good gait mechanics to promote safe community ambulation.   Time 6   Period Weeks   Status Partially Met   PT LONG TERM GOAL #4   Title Pt will demonstrate improvement of 2 MMT in all deficent muscle groups of the LEs to promote improved balance and gait.    Time 6   Period Weeks   Status On-going   PT LONG TERM GOAL #5   Title Pt will report being able to return to 20 min of exercise on elliptical or water aerobics 2-3 days/wk with no more than 1/10 pain during or after activity.    Time 6   Period Weeks   Status On-going               Plan - 07/17/15 1204    Clinical Impression Statement Continues session focus on improving mechanics with functional strengthening.  Pt making improvement with less cueing required to reduce genu valgus.  Pt c/o increased pain with lateral step ups on medial aspect of knee.  Scar tissue massage complete and instructed to medial aspect of knee to reduce adhension for pain control.  Pt able to complete lateral step ups following massage with no reports of pain.     Rehab Potential Excellent   PT Frequency 3x / week   PT Duration 6 weeks   PT Treatment/Interventions ADLs/Self Care Home  Management;Cryotherapy;Electrical Stimulation;Iontophoresis 4mg /ml Dexamethasone;DME Instruction;Gait training;Stair training;Functional mobility training;Therapeutic activities;Therapeutic exercise;Balance training;Neuromuscular re-education;Patient/family education;Manual techniques;Scar mobilization;Passive range of motion;Taping  PT Next Visit Plan Continue remaining sessions with focus on functional strengthening, gait and ROM. Use band to reduce genu valgus to improve form with therex PRN.      Patient will benefit from skilled therapeutic intervention in order to improve the following deficits and impairments:  Abnormal gait, Decreased activity tolerance, Decreased balance, Decreased endurance, Decreased knowledge of use of DME, Decreased range of motion, Decreased skin integrity, Decreased strength, Difficulty walking, Hypomobility, Increased edema, Impaired flexibility  Visit Diagnosis: Pain in left knee  Other abnormalities of gait and mobility  Stiffness of left knee, not elsewhere classified     Problem List Patient Active Problem List   Diagnosis Date Noted  . Acute medial meniscus tear of left knee    Ihor Austin, LPTA; [place]  Aldona Lento 07/17/2015, 12:15 PM  [place], [place], [address] Phone: [phone]   Fax:  [phone]  Name: HEDY GARRO MRN: [id number] Date of Birth: 11-11-1970

## 2015-07-20 ENCOUNTER — Encounter: Payer: Self-pay | Admitting: Orthopedic Surgery

## 2015-07-20 ENCOUNTER — Ambulatory Visit (HOSPITAL_COMMUNITY): Payer: BLUE CROSS/BLUE SHIELD | Admitting: Physical Therapy

## 2015-07-20 DIAGNOSIS — M25562 Pain in left knee: Secondary | ICD-10-CM | POA: Diagnosis not present

## 2015-07-20 DIAGNOSIS — R2689 Other abnormalities of gait and mobility: Secondary | ICD-10-CM

## 2015-07-20 DIAGNOSIS — M25662 Stiffness of left knee, not elsewhere classified: Secondary | ICD-10-CM

## 2015-07-20 NOTE — Therapy (Signed)
[place], [place], [address] Phone: [phone]   Fax:  [phone]  Physical Therapy Treatment  Patient Details  Name: Jodi Gray MRN: [id number] Date of Birth: [birth year] Referring Provider: Aline Brochure  Encounter Date: 07/20/2015      PT End of Session - 07/20/15 1747    Visit Number 15   Number of Visits 18   Date for PT Re-Evaluation 07/28/15   Authorization Type BCBS   Authorization Time Period 06/16/2015 - 07/28/2015   Authorization - Visit Number 15   Authorization - Number of Visits 18   PT Start Time 0300  pt arrived 15 min late   PT Stop Time 1815   PT Time Calculation (min) 29 min   Activity Tolerance Patient tolerated treatment well   Behavior During Therapy [facility] for tasks assessed/performed      Past Medical History  Diagnosis Date  . Arthritis   . Hypertension     Past Surgical History  Procedure Laterality Date  . Knee surgery      right and left.  . Tubal ligation    . Knee arthroscopy with medial menisectomy Left 06/11/2015    Procedure: KNEE ARTHROSCOPY WITH MEDIAL MENISECTOMY;  Surgeon: Carole Civil, MD;  Location: [facility];  Service: Orthopedics;  Laterality: Left;    There were no vitals filed for this visit.      Subjective Assessment - 07/20/15 1747    Subjective Pt states she went back to work today. She didn't do alot of walking but occasionally it would bother her. She feels it is swelling some in the back of the knee.    Pertinent History HTN & plantar faschitis   Patient Stated Goals Hope to get better range, decreae pain, decrease swelling, and go back to normal activities and exercise.  Water aerobics - nothing with any impact.    Currently in Pain? No/denies   Pain Onset 1 to 4 weeks ago                         [facility] Adult PT Treatment/Exercise - 07/20/15 0001    Knee/Hip Exercises: Stretches   Active Hamstring Stretch Both;3 reps;30  seconds   Active Hamstring Stretch Limitations 12in step   Quad Stretch Left;30 seconds   Quad Stretch Limitations Leg propped on chair    Knee/Hip Exercises: Machines for Strengthening   Total Gym Leg Press L29 LLE only, x10 reps with tactile/verbal cues to prevent knee valgus   Knee/Hip Exercises: Standing   Heel Raises 15 reps   Heel Raises Limitations heel and toe    Lateral Step Up Left;2 sets;15 reps;Hand Hold: 2;Step Height: 8"   Lateral Step Up Limitations cues to correct posture    Step Down 2 sets;15 reps;Hand Hold: 2;Step Height: 6";Left   Wall Squat 2 sets;15 reps                PT Education - 07/20/15 1753    Education provided Yes   Education Details Encouraged stretch breaks at work to decrease stiffness    Person(s) Educated Patient   Methods Explanation   Comprehension Verbalized understanding          PT Short Term Goals - 07/06/15 1105    PT SHORT TERM GOAL #1   Title Pt will c/o no more than 1/10 pain in L knee during all weight bearing activities.    Time 3  Period Weeks   Status Partially Met  goes higher with longer duration ambulation   PT SHORT TERM GOAL #2   Title Pt will demonstrate increased AROM of L knee flexin to 90*, and increased L knee extension to 5* to improve gait mechanics.   Time 3   Period Weeks   Status Achieved   PT SHORT TERM GOAL #3   Title Pt will demonstrate abiltiy to weight bear equally through B LE's during gait with improved posture, heel strike, and toe-off to improve balance during functional gait.    Time 3   Period Weeks   Status Achieved   PT SHORT TERM GOAL #4   Title Pt will increase muscle strenth in all deficient areas of the hip and knee by 1 MMT  grade to improve balance and gait mechanics.    Time 3   Status Partially Met   PT SHORT TERM GOAL #5   Title Pt will be independent in with correctly and consistently performing HEP to improve strength required for functional mobiltiy tasks.    Time 3    Period Weeks   Status Achieved           PT Long Term Goals - 07/06/15 1107    PT LONG TERM GOAL #1   Title Pt will express pain of 0/10 in L knee during all functional weight bearing activities to improve QOL and return to leisure activities.    Time 6   Period Weeks   Status On-going   PT LONG TERM GOAL #2   Title Pt will demonstrate improvement with L knee AROM flexion to 110*, and extension to 0* to improve mechanics of the joint during gait.    Time 6   Period Weeks   Status On-going   PT LONG TERM GOAL #3   Title Pt will report ambulating >1066f over even and uneven surfaces with no LOB and good gait mechanics to promote safe community ambulation.   Time 6   Period Weeks   Status Partially Met   PT LONG TERM GOAL #4   Title Pt will demonstrate improvement of 2 MMT in all deficent muscle groups of the LEs to promote improved balance and gait.    Time 6   Period Weeks   Status On-going   PT LONG TERM GOAL #5   Title Pt will report being able to return to 20 min of exercise on elliptical or water aerobics 2-3 days/wk with no more than 1/10 pain during or after activity.    Time 6   Period Weeks   Status On-going               Plan - 07/20/15 1803    Clinical Impression Statement Today's session continued focus on LLE strengthening with pt able to perform all increased reps and step heights without report of pain. Minimal cues required for correct technique. Overall, she is progressing towards her goals and has returned to work. Will continue with current POC.    Rehab Potential Excellent   PT Frequency 3x / week   PT Duration 6 weeks   PT Treatment/Interventions ADLs/Self Care Home Management;Cryotherapy;Electrical Stimulation;Iontophoresis '4mg'$ /ml Dexamethasone;DME Instruction;Gait training;Stair training;Functional mobility training;Therapeutic activities;Therapeutic exercise;Balance training;Neuromuscular re-education;Patient/family education;Manual  techniques;Scar mobilization;Passive range of motion;Taping   PT Next Visit Plan Continue remaining sessions with focus on functional strengthening, gait and ROM. Use band to reduce genu valgus to improve form with therex PRN.   PT Home Exercise Plan no updates this visit  Consulted and Agree with Plan of Care Patient   Family Member Consulted significant other      Patient will benefit from skilled therapeutic intervention in order to improve the following deficits and impairments:  Abnormal gait, Decreased activity tolerance, Decreased balance, Decreased endurance, Decreased knowledge of use of DME, Decreased range of motion, Decreased skin integrity, Decreased strength, Difficulty walking, Hypomobility, Increased edema, Impaired flexibility  Visit Diagnosis: Pain in left knee  Other abnormalities of gait and mobility  Stiffness of left knee, not elsewhere classified     Problem List Patient Active Problem List   Diagnosis Date Noted  . Acute medial meniscus tear of left knee     6:18 PM,07/20/2015 Elly Modena PT, DPT [facility] [place] [address] [place], [place], [address] Phone: [phone]   Fax:  [phone]  Name: Jodi Gray MRN: [id number] Date of Birth: Oct 13, 1970

## 2015-07-22 ENCOUNTER — Ambulatory Visit (HOSPITAL_COMMUNITY): Payer: BLUE CROSS/BLUE SHIELD | Admitting: Physical Therapy

## 2015-07-22 ENCOUNTER — Encounter (HOSPITAL_COMMUNITY): Payer: BLUE CROSS/BLUE SHIELD

## 2015-07-23 ENCOUNTER — Encounter: Payer: Self-pay | Admitting: Orthopedic Surgery

## 2015-07-24 ENCOUNTER — Ambulatory Visit (HOSPITAL_COMMUNITY): Payer: BLUE CROSS/BLUE SHIELD

## 2015-07-24 DIAGNOSIS — R2689 Other abnormalities of gait and mobility: Secondary | ICD-10-CM

## 2015-07-24 DIAGNOSIS — M25562 Pain in left knee: Secondary | ICD-10-CM | POA: Diagnosis not present

## 2015-07-24 DIAGNOSIS — M25662 Stiffness of left knee, not elsewhere classified: Secondary | ICD-10-CM

## 2015-07-24 NOTE — Therapy (Signed)
[place] [address] [place], [place], [address] Phone: [phone]   Fax:  [phone]  Physical Therapy Treatment  Patient Details  Name: Jodi Gray MRN: [id number] Date of Birth: 1970/11/14 Referring Provider: Arther Abbott  Encounter Date: 07/24/2015      PT End of Session - 07/24/15 1800    Visit Number 16   Number of Visits 18   Date for PT Re-Evaluation 07/28/15   Authorization Type BCBS   Authorization Time Period 06/16/2015 - 07/28/2015   Authorization - Visit Number 16   Authorization - Number of Visits 18   PT Start Time 3382  pt late for apt   PT Stop Time 1826   PT Time Calculation (min) 41 min   Activity Tolerance Patient tolerated treatment well   Behavior During Therapy [facility] for tasks assessed/performed      Past Medical History  Diagnosis Date  . Arthritis   . Hypertension     Past Surgical History  Procedure Laterality Date  . Knee surgery      right and left.  . Tubal ligation    . Knee arthroscopy with medial menisectomy Left 06/11/2015    Procedure: KNEE ARTHROSCOPY WITH MEDIAL MENISECTOMY;  Surgeon: Carole Civil, MD;  Location: [facility];  Service: Orthopedics;  Laterality: Left;    There were no vitals filed for this visit.      Subjective Assessment - 07/24/15 1746    Subjective Pt returned to work on Monday, reports increased swelling following standing for long periods of time.  Pt stated she brought ice pack for knee and has been getting up every hour and walking.  Current pain scale 3/10.   Pertinent History HTN & plantar faschitis   Patient Stated Goals Hope to get better range, decreae pain, decrease swelling, and go back to normal activities and exercise.  Water aerobics - nothing with any impact.    Currently in Pain? Yes   Pain Score 3    Pain Location Knee   Pain Orientation Left            [facility] PT Assessment - 07/24/15 0001    Assessment   Medical Diagnosis L  knee scope    Referring Provider Arther Abbott   Onset Date/Surgical Date 06/11/15   Next MD Visit 07/17   Prior Therapy Yes for Plantar fascitis   AROM   Left Knee Extension 2  was 4   Left Knee Flexion 113  was 108   Strength   Right Hip Flexion 5/5   Right Hip Extension 4+/5  was 4/5   Right Hip ABduction 4+/5  was 4+/5   Left Hip Flexion 5/5   Left Hip Extension 4+/5  was 4/5   Left Hip ABduction 4+/5  was 4/5   Right Knee Flexion 5/5   Right Knee Extension 5/5   Left Knee Flexion 5/5  was 4+/5   Left Knee Extension 4+/5  4/5 at EOS, quad limited by fatigue with therex was 4+/5            [facility] Adult PT Treatment/Exercise - 07/24/15 0001    Knee/Hip Exercises: Stretches   Active Hamstring Stretch Both;3 reps;30 seconds   Active Hamstring Stretch Limitations 12in step   Quad Stretch Left;3 reps;30 seconds   Quad Stretch Limitations prone on chair   Knee/Hip Exercises: Machines for Strengthening   Total Gym Leg Press L29 LLE only, x10 reps with tactile/verbal cues to prevent knee  valgus   Knee/Hip Exercises: Standing   Lateral Step Up Left;2 sets;15 reps;Hand Hold: 2;Step Height: 6"   Lateral Step Up Limitations cues to correct posture    Step Down 2 sets;15 reps;Hand Hold: 2;Step Height: 6";Left   Wall Squat 2 sets;15 reps              PT Short Term Goals - 07/24/15 1801    PT SHORT TERM GOAL #1   Title Pt will c/o no more than 1/10 pain in L knee during all weight bearing activities.    Baseline 07/24/2015 Average pain scale 3/10   PT SHORT TERM GOAL #2   Title Pt will demonstrate increased AROM of L knee flexin to 90*, and increased L knee extension to 5* to improve gait mechanics.   Status Achieved   PT SHORT TERM GOAL #3   Title Pt will demonstrate abiltiy to weight bear equally through B LE's during gait with improved posture, heel strike, and toe-off to improve balance during functional gait.    Baseline 08/20/2014: Able to demonstrate  appropriate gait mechanics inconsistently   Status Achieved   PT SHORT TERM GOAL #4   Title Pt will increase muscle strenth in all deficient areas of the hip and knee by 1 MMT  grade to improve balance and gait mechanics.    Baseline 07/24/2015   Status Achieved   PT SHORT TERM GOAL #5   Title Pt will be independent in with correctly and consistently performing HEP to improve strength required for functional mobiltiy tasks.    Baseline 07/24/2015:  Reports compliance with HEP daily   Status Achieved           PT Long Term Goals - 07/24/15 1803    PT LONG TERM GOAL #1   Title Pt will express pain of 0/10 in L knee during all functional weight bearing activities to improve QOL and return to leisure activities.    Baseline 07/24/2015: average pain scale 3/10   Status On-going   PT LONG TERM GOAL #2   Title Pt will demonstrate improvement with L knee AROM flexion to 110*, and extension to 0* to improve mechanics of the joint during gait.    Baseline 07/24/2015:  2-113 degress   Status Partially Met   PT LONG TERM GOAL #3   Title Pt will report ambulating >1024ft over even and uneven surfaces with no LOB and good gait mechanics to promote safe community ambulation.   Baseline 07/23/2012: 950 ft on even surfaces reports of anterior shin pain with gait   Status On-going   PT LONG TERM GOAL #4   Title Pt will demonstrate improvement of 2 MMT in all deficent muscle groups of the LEs to promote improved balance and gait.    Status Partially Met   PT LONG TERM GOAL #5   Title Pt will report being able to return to 20 min of exercise on elliptical or water aerobics 2-3 days/wk with no more than 1/10 pain during or after activity.    Status On-going               Plan - 07/24/15 1818    Clinical Impression Statement Continued session focus on improving Lt LE strengthening and reviewed goals prior MD apt on Monday.  Pt progressing well towards goals with 4/5 STGs and progressing  well towards all LTGs.  Improved ROM 2-113 degrees, strength making significant gains, improved activity tolerance with ability to ambulate 950 ft in .  Pt reports comliance with HEP daily.  Pt does continues to c/o pain medial portion of knee and c/o anterior shin pain with gait.  Pt RTW this week and does present with increased edema to knee from increased weight bearing.  Pt did report she has been applying ice during work and walks for [employer].     Rehab Potential Excellent   PT Frequency 3x / week   PT Duration 6 weeks   PT Treatment/Interventions ADLs/Self Care Home Management;Cryotherapy;Electrical Stimulation;Iontophoresis '4mg'$ /ml Dexamethasone;DME Instruction;Gait training;Stair training;Functional mobility training;Therapeutic activities;Therapeutic exercise;Balance training;Neuromuscular re-education;Patient/family education;Manual techniques;Scar mobilization;Passive range of motion;Taping   PT Next Visit Plan F/U with MD.  Pt progressing well towards all goals will forward measurements to evaluation PT for potential DC vs continued care.        Patient will benefit from skilled therapeutic intervention in order to improve the following deficits and impairments:  Abnormal gait, Decreased activity tolerance, Decreased balance, Decreased endurance, Decreased knowledge of use of DME, Decreased range of motion, Decreased skin integrity, Decreased strength, Difficulty walking, Hypomobility, Increased edema, Impaired flexibility  Visit Diagnosis: Pain in left knee  Other abnormalities of gait and mobility  Stiffness of left knee, not elsewhere classified     Problem List Patient Active Problem List   Diagnosis Date Noted  . Acute medial meniscus tear of left knee    Ihor Austin, LPTA; CBIS [phone]  Aldona Lento 07/24/2015, 6:44 PM  [place], [place], [address] Phone: [phone]   Fax:   [phone]  Name: Jodi Gray MRN: [id number] Date of Birth: 02/22/1970

## 2015-07-27 ENCOUNTER — Ambulatory Visit (INDEPENDENT_AMBULATORY_CARE_PROVIDER_SITE_OTHER): Payer: BLUE CROSS/BLUE SHIELD | Admitting: Orthopedic Surgery

## 2015-07-27 ENCOUNTER — Encounter (HOSPITAL_COMMUNITY): Payer: BLUE CROSS/BLUE SHIELD | Admitting: Physical Therapy

## 2015-07-27 ENCOUNTER — Encounter: Payer: Self-pay | Admitting: Orthopedic Surgery

## 2015-07-27 VITALS — BP 149/94 | Ht 65.5 in | Wt 179.0 lb

## 2015-07-27 DIAGNOSIS — Z4789 Encounter for other orthopedic aftercare: Secondary | ICD-10-CM

## 2015-07-27 NOTE — Patient Instructions (Addendum)
Wear leg brace when going to work and when shopping  Capsaicin with lidocaine cream 3 times a day to shin

## 2015-07-27 NOTE — Progress Notes (Signed)
Chief Complaint  Patient presents with  . Follow-up    post op LEFT KNEE ARTHROSCOPY, DOS 06/11/15   Postop visit status post knee arthroscopy 6 weeks ago patient was doing step ups seems to have aggravated her tibia shin and anteromedial knee  Otherwise her range of motion is better than preop she still has some pain at the anteromedial tibia and over the left shin  Recommend hinged knee brace and some capsaicin with lidocaine cream follow-up in a month

## 2015-07-28 NOTE — Addendum Note (Signed)
Addended by: Milinda PointerUNGER, KRISTEN E on: 07/28/2015 06:38 PM   Modules accepted: Orders

## 2015-07-29 ENCOUNTER — Ambulatory Visit (HOSPITAL_COMMUNITY): Payer: BLUE CROSS/BLUE SHIELD

## 2015-07-29 ENCOUNTER — Telehealth (HOSPITAL_COMMUNITY): Payer: Self-pay

## 2015-07-29 NOTE — Telephone Encounter (Signed)
No show, called and left message apt missed apt.  Included next apt date and time and requested pt call back and report if she plans to continue vs. DC from therapy following MD apt.  Contact info given.  89 N. Hudson DriveCasey Cockerham, LPTA; CBIS [phone]

## 2015-07-30 ENCOUNTER — Telehealth (HOSPITAL_COMMUNITY): Payer: Self-pay | Admitting: Physical Therapy

## 2015-07-30 ENCOUNTER — Encounter: Payer: Self-pay | Admitting: Orthopedic Surgery

## 2015-07-30 ENCOUNTER — Ambulatory Visit (HOSPITAL_COMMUNITY): Payer: BLUE CROSS/BLUE SHIELD | Admitting: Physical Therapy

## 2015-07-30 NOTE — Telephone Encounter (Signed)
LMOM reminding pt of missed apt today and her next apt tomorrow. Reminded of no show policy and that we will have to remove her from schedule if she has another no show. Left office # to call with questions.   6:04 PM,07/30/2015 Marylyn IshiharaSara Kiser PT, DPT [facility] [phone]

## 2015-07-31 ENCOUNTER — Ambulatory Visit (HOSPITAL_COMMUNITY): Payer: BLUE CROSS/BLUE SHIELD | Admitting: Physical Therapy

## 2015-07-31 ENCOUNTER — Telehealth (HOSPITAL_COMMUNITY): Payer: Self-pay

## 2015-07-31 NOTE — Telephone Encounter (Signed)
07/31/15 pt left us a message to say she didn't come to the last 2 appts because she was told that she didn't need to return.  I spoke to Huntley DecSara and she understood Baird LyonsCasey as saying to the patient that if she was doing well enough and didn't need to return then to let us know.  It was left up to the patient as to what she wanted to do.  I left her a message asking to let us know about today's appt.   It is the last appt on her schedule anyway.

## 2015-08-27 ENCOUNTER — Encounter: Payer: Self-pay | Admitting: Orthopedic Surgery

## 2015-08-27 ENCOUNTER — Ambulatory Visit (INDEPENDENT_AMBULATORY_CARE_PROVIDER_SITE_OTHER): Payer: BLUE CROSS/BLUE SHIELD | Admitting: Orthopedic Surgery

## 2015-08-27 VITALS — BP 147/97 | HR 78 | Ht 65.25 in | Wt 179.0 lb

## 2015-08-27 DIAGNOSIS — Z4789 Encounter for other orthopedic aftercare: Secondary | ICD-10-CM | POA: Diagnosis not present

## 2015-08-27 DIAGNOSIS — Z9889 Other specified postprocedural states: Secondary | ICD-10-CM

## 2015-08-27 NOTE — Progress Notes (Signed)
Postop status post arthroscopy left knee and anterior medial meniscectomy on June 1. She continues to have some anterior shin and anterior proximal tibial pain especially with walking.  Exam shows no tenderness at the meniscal capsular junction but tenderness over the anterior medial tibia  Recommend injection  Inject left knee She gave verbal consent We confirm the site by timeout I injected Depo-Medrol 40 mg and lidocaine 1% 3 mL May clean the skin with alcohol and use at the chloride for anesthesia  She will come back in 3 weeks

## 2015-08-27 NOTE — Patient Instructions (Signed)

## 2015-09-18 ENCOUNTER — Ambulatory Visit: Payer: BLUE CROSS/BLUE SHIELD | Admitting: Orthopedic Surgery

## 2015-09-21 ENCOUNTER — Ambulatory Visit: Payer: BLUE CROSS/BLUE SHIELD | Admitting: Orthopedic Surgery

## 2015-09-22 ENCOUNTER — Ambulatory Visit (INDEPENDENT_AMBULATORY_CARE_PROVIDER_SITE_OTHER): Payer: BLUE CROSS/BLUE SHIELD | Admitting: Orthopedic Surgery

## 2015-09-22 ENCOUNTER — Encounter: Payer: Self-pay | Admitting: Orthopedic Surgery

## 2015-09-22 VITALS — BP 121/81 | HR 73 | Ht 66.0 in | Wt 174.0 lb

## 2015-09-22 DIAGNOSIS — Z9889 Other specified postprocedural states: Secondary | ICD-10-CM

## 2015-09-22 DIAGNOSIS — Z4789 Encounter for other orthopedic aftercare: Secondary | ICD-10-CM

## 2015-09-22 NOTE — Progress Notes (Signed)
Patient ID: Jodi Gray, female   DOB: 02/07/1970, 45 y.o.   MRN: [id number]  Post op visit   Chief Complaint  Patient presents with  . Follow-up    POST OP SALK, DOS 06/11/15    BP 121/81   Pulse 73   Ht 5\' 6"  (1.676 m)   Wt 174 lb (78.9 kg)   BMI 28.08 kg/m   Status post injection anteromedial tibia status post arthroscopy  status post arthroscopy left knee and anterior medial meniscectomy on June 1. She continues to have some anterior shin and anterior proximal tibial pain especially with walking.   Exam shows no tenderness at the meniscal capsular junction but tenderness over the anterior medial tibia   Injection did not improve the symptoms. Pain got worse  Recommend preparation of ketamine baclofen gabapentin lidocaine 1 g 3 times a day  ASSESSMENT AND PLAN   Four-week follow-up

## 2015-10-21 ENCOUNTER — Encounter: Payer: Self-pay | Admitting: Orthopedic Surgery

## 2015-10-21 ENCOUNTER — Ambulatory Visit (INDEPENDENT_AMBULATORY_CARE_PROVIDER_SITE_OTHER): Payer: BLUE CROSS/BLUE SHIELD | Admitting: Orthopedic Surgery

## 2015-10-21 VITALS — BP 148/99 | HR 76 | Wt 173.0 lb

## 2015-10-21 DIAGNOSIS — Z9889 Other specified postprocedural states: Secondary | ICD-10-CM | POA: Diagnosis not present

## 2015-10-21 DIAGNOSIS — S8992XD Unspecified injury of left lower leg, subsequent encounter: Secondary | ICD-10-CM

## 2015-10-21 NOTE — Progress Notes (Signed)
Patient ID: Jodi KindredRhonda T Gray, female   DOB: [birth year], 45 y.o.   MRN: [id number]  Chief Complaint  Patient presents with  . Follow-up    SALK 06/11/15    HPI Jodi Gray is a 45 y.o. female.   HPI Presents for follow-up status post arthroscopy with anteromedial pain which has been relieved by pharmaceutical mixture of medications.  She does complain of pain with weightbearing on the medial joint.  Review of Systems Review of Systems Normal neuro  Denies fever  Examination BP (!) 148/99   Pulse 76   Wt 173 lb (78.5 kg)   BMI 27.92 kg/m   Gen. appearance the patient's appearance is normal with normal grooming and  hygiene The patient is oriented to person place and time Mood and affect are normal   Ortho Exam Gait is remarkable for no limp at present Inspection reveals medial joint line tenderness ROM is limited as noted preop to 115 Stability tests are normal  Motor exam 5/5 manual muscle testing , no atrophy  Skin is normal (no rash or erythema)    Medical decision-making Diagnosis, Data, Plan (risk)  Continue current topical mixture. Return in 6 weeks.   Jodi CanadaStanley Jomo Forand, MD 10/21/2015 10:49 AM

## 2015-12-02 ENCOUNTER — Ambulatory Visit: Payer: BLUE CROSS/BLUE SHIELD | Admitting: Orthopedic Surgery

## 2015-12-21 ENCOUNTER — Ambulatory Visit (INDEPENDENT_AMBULATORY_CARE_PROVIDER_SITE_OTHER): Payer: BLUE CROSS/BLUE SHIELD

## 2015-12-21 ENCOUNTER — Ambulatory Visit (INDEPENDENT_AMBULATORY_CARE_PROVIDER_SITE_OTHER): Payer: BLUE CROSS/BLUE SHIELD | Admitting: Orthopedic Surgery

## 2015-12-21 DIAGNOSIS — Z9889 Other specified postprocedural states: Secondary | ICD-10-CM

## 2015-12-21 DIAGNOSIS — M25562 Pain in left knee: Secondary | ICD-10-CM | POA: Diagnosis not present

## 2015-12-21 DIAGNOSIS — G8929 Other chronic pain: Secondary | ICD-10-CM | POA: Diagnosis not present

## 2015-12-21 DIAGNOSIS — M1712 Unilateral primary osteoarthritis, left knee: Secondary | ICD-10-CM

## 2015-12-21 DIAGNOSIS — Z9660 Presence of unspecified orthopedic joint implant: Secondary | ICD-10-CM | POA: Diagnosis not present

## 2015-12-21 MED ORDER — PREDNISONE 5 MG PO TABS: 5.0000 mg | ORAL_TABLET | Freq: Every day | ORAL | 0 refills | Status: DC

## 2015-12-21 NOTE — Progress Notes (Signed)
Patient ID: Jodi Gray, female   DOB: [birth year], 45 y.o.   MRN: [id number]  Chief Complaint  Patient presents with  . Follow-up    Recheck left knee, DOS 06-11-15    HPI Jodi Gray is a 45 y.o. female.   HPI  45 year old female had a left patella replacement several years ago and is now status post arthroscopy left knee June 2017. Reviewing her operative report she has osteoarthritis with grade 4 defects in the tibial plateau and mediofemoral condyle of the left knee. We did have to remove a portion of her anterior medial meniscus she's continued complain of pain over that area. She has what I call the uncovering phenomenon i.e. removal of torn meniscal tissue with underlying grade 4 chondral defect.  She said she did do well with the topical cream preparation which included some lidocaine but she has now regressed with increasing pain despite taking her pain medication.    Review of Systems Review of Systems  History of back pain without radiculopathy  No fever or rash or joint effusion    Physical Exam  [name] is a well-developed well-nourished well-groomed healthy-appearing African-American female with mesomorphic body habitus. She is oriented 3 her mood is pleasant her affect is happy her gait is normal she has tenderness in the anterior medial joint line without effusion she has a midline scar from her patellofemoral replacement which she'll nicely with negative Tinel's throughout the incision line. Her range of motion is always been limited to approximately 105 410 the knee remains stable including the patellofemoral impression she has no quadriceps weakness.  X-ray was obtained on the lateral x-ray you can see in the anterior tibia there is a large osteophyte/bone spur she has joint space narrowing medial compartment  Impression osteoarthritis left knee Status post patella replacement  Plan intra-articular cortisone injection  Procedure note left knee injection  verbal consent was obtained to inject left knee joint  Timeout was completed to confirm the site of injection  The medications used were 40 mg of Depo-Medrol and 1% lidocaine 3 cc  Anesthesia was provided by ethyl chloride and the skin was prepped with alcohol.  After cleaning the skin with alcohol a 20-gauge needle was used to inject the left knee joint. There were no complications. A sterile bandage was applied.   Follow-up in

## 2015-12-23 ENCOUNTER — Telehealth: Payer: Self-pay | Admitting: Orthopedic Surgery

## 2015-12-23 NOTE — Telephone Encounter (Signed)
yes

## 2015-12-23 NOTE — Telephone Encounter (Signed)
Patient requests updated temporary handicapped sticker; states hers expires 12/10/15.  Please advise.  Patient ph# [phone]

## 2015-12-24 NOTE — Telephone Encounter (Signed)
Called patient; aware to pick up.

## 2016-01-18 ENCOUNTER — Ambulatory Visit: Payer: BLUE CROSS/BLUE SHIELD | Admitting: Orthopedic Surgery

## 2016-01-25 ENCOUNTER — Encounter: Payer: Self-pay | Admitting: Orthopedic Surgery

## 2016-01-25 ENCOUNTER — Ambulatory Visit (INDEPENDENT_AMBULATORY_CARE_PROVIDER_SITE_OTHER): Payer: BLUE CROSS/BLUE SHIELD | Admitting: Orthopedic Surgery

## 2016-01-25 DIAGNOSIS — Z9660 Presence of unspecified orthopedic joint implant: Secondary | ICD-10-CM | POA: Diagnosis not present

## 2016-01-25 DIAGNOSIS — Z9889 Other specified postprocedural states: Secondary | ICD-10-CM | POA: Diagnosis not present

## 2016-01-25 DIAGNOSIS — M1712 Unilateral primary osteoarthritis, left knee: Secondary | ICD-10-CM | POA: Diagnosis not present

## 2016-01-25 NOTE — Progress Notes (Signed)
Chief Complaint  Patient presents with  . Follow-up    SALK 06/11/15    Status post arthroscopy left knee status post injection  Arthroscopic findings include grade 4 defects in the tibial plateau and medial femoral condyle left knee  The patient has improved with injection now has mild pain with twisting maneuvers over the medial compartment  Review of systems pain right knee  Exam shows normal ambulatory status. Mild tenderness medial tibial plateau and medial joint line. Range of motion approximately 105 of flexion stability is normal incision is clean dry and intact from prior patella replacement muscle strength in extension is normal pulse and perfusion normal sensation normal left leg  Encounter Diagnoses  Name Primary?  . S/P left knee arthroscopy Yes  . History of joint replacement, unspecified joint   . Primary osteoarthritis of left knee     She has improved with injection I suspect she will need further injections and hyaluronic acid injection in the future  She'll call us when she wants another cortisone shot

## 2016-03-31 ENCOUNTER — Encounter: Payer: Self-pay | Admitting: Orthopedic Surgery

## 2016-04-04 ENCOUNTER — Ambulatory Visit (INDEPENDENT_AMBULATORY_CARE_PROVIDER_SITE_OTHER): Payer: BLUE CROSS/BLUE SHIELD | Admitting: Orthopedic Surgery

## 2016-04-04 ENCOUNTER — Encounter: Payer: Self-pay | Admitting: Orthopedic Surgery

## 2016-04-04 ENCOUNTER — Ambulatory Visit (INDEPENDENT_AMBULATORY_CARE_PROVIDER_SITE_OTHER): Payer: BLUE CROSS/BLUE SHIELD

## 2016-04-04 VITALS — BP 116/65 | HR 81 | Ht 66.0 in | Wt 174.0 lb

## 2016-04-04 DIAGNOSIS — M25561 Pain in right knee: Secondary | ICD-10-CM

## 2016-04-04 DIAGNOSIS — S76111A Strain of right quadriceps muscle, fascia and tendon, initial encounter: Secondary | ICD-10-CM | POA: Diagnosis not present

## 2016-04-04 MED ORDER — IBUPROFEN 800 MG PO TABS: 800.0000 mg | ORAL_TABLET | Freq: Three times a day (TID) | ORAL | 0 refills | Status: DC | PRN

## 2016-04-04 NOTE — Patient Instructions (Addendum)
Knee Pain, Adult Many things can cause knee pain. The pain often goes away on its own with time and rest. If the pain does not go away, tests may be done to find out what is causing the pain. Follow these instructions at home: Activity   Rest your knee.  Do not do things that cause pain.  Avoid activities where both feet leave the ground at the same time (high-impact activities). Examples are running, jumping rope, and doing jumping jacks. General instructions   Take medicines only as told by your doctor.  Raise (elevate) your knee when you are resting. Make sure your knee is higher than your heart.  Sleep with a pillow under your knee.   put ice on the knee:  Put ice in a plastic bag.  Place a towel between your skin and the bag.  Leave the ice on for 20 minutes, 2-3 times a day.  Ask your doctor if you should wear an elastic knee support.  Lose weight if you are overweight. Being overweight can make your knee hurt more.  Do not use any tobacco products. These include cigarettes, chewing tobacco, or electronic cigarettes. If you need help quitting, ask your doctor. Smoking may slow down healing. Contact a doctor if:  The pain does not stop.  The pain changes or gets worse.  You have a fever along with knee pain.  Your knee gives out or locks up.  Your knee swells, and becomes worse. Get help right away if:  Your knee feels warm.  You cannot move your knee.  You have very bad knee pain.  You have chest pain.  You have trouble breathing. Summary  Many things can cause knee pain. The pain often goes away on its own with time and rest.  Avoid activities that put stress on your knee. These include running and jumping rope.  Get help right away if you cannot move your knee, or if your knee feels warm, or if you have trouble breathing. This information is not intended to replace advice given to you by your health care provider. Make sure you discuss any questions  you have with your health care provider. Document Released: 03/25/2008 Document Revised: 12/22/2015 Document Reviewed: 12/22/2015 Elsevier Interactive Patient Education  2017 [employer]

## 2016-04-04 NOTE — Progress Notes (Signed)
Patient ID: Jodi KindredRhonda T Yeldell, female   DOB: 24-Sep-1970, 46 y.o.   MRN: [id number]  Chief Complaint  Patient presents with  . Knee Pain    right knee pain s/p fall 04/02/16    HPI Jodi Gray is a 46 y.o. female.  46 year old female fell on flexed knee 2 days ago complains of pain in the knee is walking. Pain is at the superior pole of patella  Pain is moderate dull nonradiating history  Review of Systems Review of Systems  Constitutional: Negative for fever.  Neurological: Positive for weakness. Negative for numbness.   (2 MINIMUM)  Past Medical History:  Diagnosis Date  . Arthritis   . Hypertension     Past Surgical History:  Procedure Laterality Date  . KNEE ARTHROSCOPY WITH MEDIAL MENISECTOMY Left 06/11/2015   Procedure: KNEE ARTHROSCOPY WITH MEDIAL MENISECTOMY;  Surgeon: Vickki HearingStanley E Harrison, MD;  Location: [facility];  Service: Orthopedics;  Laterality: Left;  . KNEE SURGERY     right and left.  . TUBAL LIGATION      Social History Social History  Substance Use Topics  . Smoking status: Passive Smoke Exposure - Never Smoker  . Smokeless tobacco: Never Used  . Alcohol use Yes     Comment: occasionally    Allergies  Allergen Reactions  . Lisinopril Hives and Swelling    Swelling to throat, lips.    Current Meds  Medication Sig  . bisoprolol-hydrochlorothiazide (ZIAC) 2.5-6.25 MG tablet Take 1 tablet by mouth daily.  Marland Kitchen. HYDROcodone-acetaminophen (NORCO) 7.5-325 MG tablet TAKE 1 TABLET BY MOUTH EVERY 8 HOURS AS NEEDED FOR MODERATE PAIN  . ibuprofen (ADVIL,MOTRIN) 800 MG tablet Take 1 tablet (800 mg total) by mouth every 8 (eight) hours as needed.  . [DISCONTINUED] ibuprofen (ADVIL,MOTRIN) 800 MG tablet Take 1 tablet (800 mg total) by mouth every 8 (eight) hours as needed.      Physical Exam Physical Exam 1.BP 116/65   Pulse 81   Ht 5\' 6"  (1.676 m)   Wt 174 lb (78.9 kg)   LMP 04/02/2016 (Exact Date)   BMI 28.08 kg/m   2. Gen. appearance. The patient is  well-developed and well-nourished, grooming and hygiene are normal. There are no gross congenital abnormalities  3. The patient is alert and oriented to person place and time  4. Mood and affect are normal  5. Ambulation slight antalgic   Examination reveals the following: 6. On inspection we find tenderness at the superior pole of patella at the quadriceps tendon insertion  7. With the range of motion of  passive range of motion is 25 range of motion is pain  8. Stability tests were normal    9. Strength tests revealed grade 5 motor strength  10. Skin we find no rash ulceration or erythema  11. Sensation remains intact  12 Vascular system shows no peripheral edema  Straight leg raise was intact  MEDICAL DECISION MAKING:    Data Reviewed X-ray shows a patella PRIOR screw from no acute fracture  Assessment Encounter Diagnoses  Name Primary?  . Acute pain of right knee Yes  . Quadriceps muscle strain, right, initial encounter      Plan Brace for 6 weeks active heel slides fu  6 weeks   Fuller CanadaStanley Harrison 04/04/2016, 2:20 PM

## 2016-04-18 ENCOUNTER — Ambulatory Visit (INDEPENDENT_AMBULATORY_CARE_PROVIDER_SITE_OTHER): Payer: BLUE CROSS/BLUE SHIELD | Admitting: Orthopedic Surgery

## 2016-04-18 ENCOUNTER — Encounter: Payer: Self-pay | Admitting: Orthopedic Surgery

## 2016-04-18 DIAGNOSIS — S76111A Strain of right quadriceps muscle, fascia and tendon, initial encounter: Secondary | ICD-10-CM

## 2016-04-18 DIAGNOSIS — Z9889 Other specified postprocedural states: Secondary | ICD-10-CM | POA: Diagnosis not present

## 2016-04-18 DIAGNOSIS — M1712 Unilateral primary osteoarthritis, left knee: Secondary | ICD-10-CM | POA: Diagnosis not present

## 2016-04-18 MED ORDER — DICLOFENAC POTASSIUM 50 MG PO TABS: 50.0000 mg | ORAL_TABLET | Freq: Two times a day (BID) | ORAL | 3 refills | Status: DC

## 2016-04-18 MED ORDER — HYLAN G-F 20 48 MG/6ML IX SOSY: PREFILLED_SYRINGE | INTRA_ARTICULAR | 0 refills | Status: DC

## 2016-04-18 NOTE — Progress Notes (Signed)
Chief Complaint  Patient presents with  . Follow-up    LEFT KNEE PAIN, SALK MM 06/11/15    Recheck right knee status post trauma. Patient strained her quadriceps that has resolved although she still experiences some clicking and popping  Patient arthroscopy left knee last year around June 2017, intermittent pain that comes and goes appears to be activity related  We have tried cortisone injection bracing ibuprofen  Review of systems no unstable episodes but feeling of lack of confidence in the right and left knee with feeling of subluxation episode  Exam shows tenderness in the left knee over the past tendons medial joint line medial tibia medial femur  No effusion  She has a well-healed incision from her prior surgery which included realignment of extensor mechanism eventually leading to a patellofemoral arthroplasty  When we did her knee scope we did find torn meniscus and arthritis and anteromedial portion of the knee  Neurovascular exam is otherwise intact pulses are good sensation is normal  No instability to anterior drawer posterior drawer test collateral ligament stable. Peripatellar tenderness is also noted without effusion  Encounter Diagnoses  Name Primary?  . Quadriceps muscle strain, right, initial encounter Yes  . S/P left knee arthroscopy   . Primary osteoarthritis of left knee     Oral anti-inflammatories and or the disc or Synvisc hyaluronic acid injection left knee  Meds ordered this encounter  Medications  . diclofenac (CATAFLAM) 50 MG tablet    Sig: Take 1 tablet (50 mg total) by mouth 2 (two) times daily.    Dispense:  90 tablet    Refill:  3  . Hylan 48 MG/6ML SOSY    Sig: INJECT ONE SYRINGE INTO LEFT KNEE ONCE    Dispense:  1 Syringe    Refill:  0

## 2016-04-26 ENCOUNTER — Other Ambulatory Visit: Payer: Self-pay | Admitting: *Deleted

## 2016-04-26 MED ORDER — SODIUM HYALURONATE (VISCOSUP) 25 MG/2.5ML IX SOSY: PREFILLED_SYRINGE | INTRA_ARTICULAR | 0 refills | Status: DC

## 2016-05-16 ENCOUNTER — Ambulatory Visit: Payer: BLUE CROSS/BLUE SHIELD | Admitting: Orthopedic Surgery

## 2016-05-27 ENCOUNTER — Ambulatory Visit (INDEPENDENT_AMBULATORY_CARE_PROVIDER_SITE_OTHER): Payer: BLUE CROSS/BLUE SHIELD | Admitting: Orthopedic Surgery

## 2016-05-27 DIAGNOSIS — S83241D Other tear of medial meniscus, current injury, right knee, subsequent encounter: Secondary | ICD-10-CM

## 2016-05-27 NOTE — Addendum Note (Signed)
Addended by: Adella HareBOOTHE, JAIME B on: 05/27/2016 01:10 PM   Modules accepted: Orders

## 2016-05-27 NOTE — Progress Notes (Signed)
  Follow-up OFFICE VISIT    Chief Complaint  Patient presents with  . Follow-up    6 week recheck on right knee.    This is a 46 year old female who had trouble with her right knee and we treated her for quadriceps tendon strain however, she was walking her toe caught arthritic and she tripped and twisted her right knee again. She noticed subsequent swelling anteromedial knee pain persistent popping and instability in the right knee which is worse after sitting. This is associated with medial and posterior medial joint pain stiffness and weakness of the right leg.  She took ibuprofen and did some home exercises and did not improve she presents back for evaluation and treatment    Review of Systems  Constitutional: Negative for chills and fever.  Respiratory: Negative for shortness of breath.   Cardiovascular: Negative for chest pain.  Neurological: Negative for tingling and focal weakness.     Past Medical History:  Diagnosis Date  . Arthritis   . Hypertension     Past Surgical History:  Procedure Laterality Date  . KNEE ARTHROSCOPY WITH MEDIAL MENISECTOMY Left 06/11/2015   Procedure: KNEE ARTHROSCOPY WITH MEDIAL MENISECTOMY;  Surgeon: Vickki HearingStanley E Plumer Mittelstaedt, MD;  Location: [facility];  Service: Orthopedics;  Laterality: Left;  . KNEE SURGERY     right and left.  . TUBAL LIGATION      No family history on file. Social History  Substance Use Topics  . Smoking status: Passive Smoke Exposure - Never Smoker  . Smokeless tobacco: Never Used  . Alcohol use Yes     Comment: occasionally    There were no vitals taken for this visit.  Physical Exam  Constitutional: She is oriented to person, place, and time. She appears well-developed and well-nourished.  Neurological: She is alert and oriented to person, place, and time.  Psychiatric: She has a normal mood and affect.  Vitals reviewed.   Ortho Exam The patient is ambulatory but she is limping on the right leg she does not bring  the leg to full extension on single leg stance phase  She has an effusion she has decreased range of motion. She has a chronically unstable patella laterally. The cruciates and collateral ligaments are stable the motor exam shows weakness on knee extension neurovascular exam is intact  Left knee is still somewhat symptomatic from prior arthroscopy with persistent anteromedial knee pain. She has limited range of motion at 110 that is chronic. Her knee is stable she has an implant there patellofemoral replacement motor exam normal neurovascular exam intact   No orders of the defined types were placed in this encounter.   Encounter Diagnosis  Name Primary?  . Acute medial meniscus tear of right knee, subsequent encounter Yes     PLAN:   Recommend MRI right knee. Question of this point is whether we can arthroscopically help her with her right knee or whether or not the instability and arthritis WITH confounding variables that she will have to have some type of arthroplasty  Come back after MRI

## 2016-06-07 ENCOUNTER — Ambulatory Visit (HOSPITAL_COMMUNITY)
Admission: RE | Admit: 2016-06-07 | Discharge: 2016-06-07 | Disposition: A | Discharge status: Home / Self Care | Payer: BLUE CROSS/BLUE SHIELD | Source: Ambulatory Visit | Attending: Orthopedic Surgery | Admitting: Orthopedic Surgery

## 2016-06-07 DIAGNOSIS — R938 Abnormal findings on diagnostic imaging of other specified body structures: Secondary | ICD-10-CM | POA: Diagnosis not present

## 2016-06-07 DIAGNOSIS — S83241D Other tear of medial meniscus, current injury, right knee, subsequent encounter: Secondary | ICD-10-CM | POA: Diagnosis not present

## 2016-06-07 DIAGNOSIS — M25461 Effusion, right knee: Secondary | ICD-10-CM | POA: Insufficient documentation

## 2016-06-07 DIAGNOSIS — X58XXXD Exposure to other specified factors, subsequent encounter: Secondary | ICD-10-CM | POA: Diagnosis not present

## 2016-06-07 DIAGNOSIS — M2410 Other articular cartilage disorders, unspecified site: Secondary | ICD-10-CM | POA: Diagnosis not present

## 2016-06-10 ENCOUNTER — Ambulatory Visit (INDEPENDENT_AMBULATORY_CARE_PROVIDER_SITE_OTHER): Payer: BLUE CROSS/BLUE SHIELD | Admitting: Orthopedic Surgery

## 2016-06-10 DIAGNOSIS — M1711 Unilateral primary osteoarthritis, right knee: Secondary | ICD-10-CM

## 2016-06-10 DIAGNOSIS — Q682 Congenital deformity of knee: Secondary | ICD-10-CM | POA: Diagnosis not present

## 2016-06-10 DIAGNOSIS — S83001D Unspecified subluxation of right patella, subsequent encounter: Secondary | ICD-10-CM | POA: Diagnosis not present

## 2016-06-10 DIAGNOSIS — Q741 Congenital malformation of knee: Secondary | ICD-10-CM

## 2016-06-10 NOTE — Patient Instructions (Signed)
You have decided to proceed with operative arthroscopy of the knee. You have decided not to continue with nonoperative measures such as but not limited to oral medication, weight loss, activity modification, physical therapy, bracing, or injection.  We will perform operative arthroscopy of the knee. Some of the risks associated with arthroscopic surgery of the knee include but are not limited to Bleeding Infection Swelling Stiffness Blood clot Pain  If you're not comfortable with these risks and would like to continue with nonoperative treatment please let Dr. Harrison know prior to your surgery. 

## 2016-06-10 NOTE — Progress Notes (Signed)
Patient ID: Jodi Gray, female   DOB: 1970/03/16, 46 y.o.   MRN: [id number]  Chief complaint pain right knee with prior MRI  46 year old female had some type of house her procedure for dislocating patella back in 1997 at another facility comes in with MRI follow-up after persistent pain giving way in her right knee    Review of Systems  Constitutional: Negative for chills, fever and weight loss.  Respiratory: Negative for shortness of breath.   Cardiovascular: Negative for chest pain.  Neurological: Negative for tingling.      There were no vitals taken for this visit. Gen. appearance is normal grooming and hygiene Orientation to person place and time normal Mood normal Gait is normal   Sensory exam shows normal sensation to palpation, pressure and soft touch Skin exam no lacerations ulcerations or erythema  Ortho Exam  As we noted before her knee Seems to sit sideways she has medial joint line tenderness flexion is limited to about 105 this is chronic. 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 Severe attenuation of the medial meniscus most consistent with prior extensive meniscectomy and/or new tear. No prior MRI is available for direct comparison. 2. Marrow edema in the periphery of the anterolateral femoral condyle with lateral patellar tilting and lateralization of the patellar tendon insertion relative to the trochlear groove. Overall findings are concerning for remote transient lateral patellar dislocation. 3. High-grade partial-thickness cartilage loss with areas of full-thickness cartilage loss of the medial and lateral patellar facets.     Electronically Signed   By: Elige KoHetal  Patel   On: 06/08/2016 08:35   This is a very difficult case. I've advised her to have arthroscopic surgery and then if that doesn't relieve her symptoms  then proceed with a knee replacement even at her young age  Right knee arthroscopy and debridement Fuller CanadaStanley Daeron Carreno, MD 06/10/2016 11:32 AM

## 2016-06-15 ENCOUNTER — Other Ambulatory Visit: Payer: Self-pay | Admitting: *Deleted

## 2016-07-11 ENCOUNTER — Telehealth: Payer: Self-pay | Admitting: Orthopedic Surgery

## 2016-07-11 ENCOUNTER — Encounter (HOSPITAL_COMMUNITY): Payer: Self-pay | Admitting: Orthopedic Surgery

## 2016-07-11 ENCOUNTER — Encounter: Payer: Self-pay | Admitting: Orthopedic Surgery

## 2016-07-11 NOTE — Telephone Encounter (Signed)
Yes, for Left Knee as noted.  (not for right knee for which surgery is scheduled on 07/28/16)

## 2016-07-11 NOTE — Telephone Encounter (Signed)
So this is for PPI left knee ???

## 2016-07-11 NOTE — Telephone Encounter (Signed)
Patient called to inquire about left knee (in reference to injuy to this knee in March of 2017) / date of surgery on this knee done by Dr Romeo AppleHarrison 06/11/15) -- states that she spoke with Dr Romeo AppleHarrison personally in regard to a permanent impairment rating. Requesting a letter indicating permanent impairment and including any other relevant information the letter would need to contain.  Her best contact # is (work) [phone],Ext [phone].

## 2016-07-12 ENCOUNTER — Encounter: Payer: Self-pay | Admitting: Orthopedic Surgery

## 2016-07-12 NOTE — Telephone Encounter (Signed)
Contacted patient to notify note is ready, per Dr Mort SawyersHarrison's staff message 07/12/16.

## 2016-07-12 NOTE — Care Management (Signed)
PPI for left knee  Initial history and physical Jodi Gray is an 46 y.o. female.   Chief Complaint: left knee pain  HPI: The patient twisted her knee on March 28 she had a previous patellofemoral arthroplasty in 2005. She was coming out of [employer] in [place] twisted the knee presents with 7 out of 10 pain swelling stiffness left knee. Initial treatment immobilizer crutches and oral pain medication. She eventually had an MRI because the knee did not get better. The knee on MRI showed probable tear of the anterior horn medial meniscus and artifact from the previous surgery. She will have arthroscopic surgery left knee secondary ongoing pain with specific attention paid to the anterior horn medial meniscus       Past Medical History  Diagnosis Date  . Arthritis    . Hypertension              Past Surgical History  Procedure Laterality Date  . Knee surgery          right and left.  . Tubal ligation          No arthritis or heart disease in family history  Social History:  reports that she has been passively smoking.  She does not have any smokeless tobacco history on file. She reports that she drinks alcohol. She reports that she does not use illicit drugs.   Allergies:       Allergies  Allergen Reactions  . Lisinopril Hives and Swelling      Swelling to throat, lips.      No prescriptions prior to admission      Lab Results Last 48 Hours  No results found for this or any previous visit (from the past 48 hour(s)).   Imaging Results (Last 48 hours)  No results found.     Review of Systems  Constitutional: Negative for fever, chills and weight loss.  Eyes: Negative for blurred vision.  Musculoskeletal: Negative for myalgias.  Neurological: Negative for seizures.  Endo/Heme/Allergies: Negative for environmental allergies and polydipsia. Does not bruise/bleed easily.  Psychiatric/Behavioral: Negative for hallucinations.  All other systems reviewed and are negative.     There were no vitals taken for this visit. Physical Exam  BP 132/92 mmHg  Pulse 78  Temp(Src) 97.9 F (36.6 C) (Oral)  Resp 14  SpO2 96%   Gen. appearance patient is well developed well groomed on nourishment normal hygiene normal Oriented 3 Mood affect normal She has a slight favoring the left leg     The upper extremities show no contracture subluxation atrophy or tremor skin normal pulses good lymph nodes negative and normal sensation (right and left upper extremity)   On the left knee trephine anteromedial joint line tenderness small effusion knee flexion approximately 125. No instability. Strength normal. Skin normal. Pulses normal. Sensation normal. She has a mildly positive McMurray sign.   The she has no pathologic reflexes in the upper lower extremity she has normal balance and coordination   She has no lymphadenopathy     Assessment/Plan MRI shows probable tear anteromedial horn medial meniscus   MRI report IMPRESSION: 1. Postsurgical changes from previous patellofemoral arthroplasty and patellar realignment procedure. 2. Suspected degenerative tear of the anterior horn and body of the medial meniscus. 3. The lateral meniscus, cruciate and collateral ligaments appear intact. 4. 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     Electronically Signed   By: Carey Bullocks M.D.   On: 04/21/2015 09:29  Torn medial meniscus left knee   Arthroscopy left knee partial medial meniscectomy   Jodi CanadaStanley Jodi Gladman, MD 06/10/2015, 5:47 PM  Surgical report Preop diagnosis torn medial meniscus left knee Postop diagnosis same Procedure arthroscopy left knee with partial medial meniscectomy Surgical findings anterior horn medial meniscal tear  Chondrosis medial femoral condyle medial tibial plateau  Degenerative PCL, normal anterior cruciate ligament  Normal lateral compartment  Normal tracking of the artificial patellofemoral joint and  normal appearance of the implant Surgeon was Romeo AppleHarrison   Most recent Physical Exam   Jodi Gray is a well-developed well-nourished well-groomed healthy-appearing African-American female with mesomorphic body habitus. She is oriented 3 her mood is pleasant her affect is happy her gait is normal she has tenderness in the anterior medial joint line without effusion she has a midline scar from her patellofemoral replacement which she'll nicely with negative Tinel's throughout the incision line. Her range of motion is always been limited to approximately 105 410 the knee remains stable including the patellofemoral impression she has no quadriceps weakness.   X-ray was obtained on the lateral x-ray you can see in the anterior tibia there is a large osteophyte/bone spur she has joint space narrowing medial compartment   Impression osteoarthritis left knee Status post patella replacement   Permanent partial impairment 10% for arthroscopic surgery and 5% for moving anterior portion of the medial meniscus  No additional percentages were given for patellofemoral replacement in   07/12/2016 1:52 PM Jodi CanadaStanley Jodi Tierney, MD

## 2016-07-12 NOTE — Progress Notes (Signed)
[facility]  Permanent partial impairment rating for [name]   History and physical Chief Complaint: left knee pain  HPI: The patient twisted her knee on March 28 she had a previous patellofemoral arthroplasty in 2005. She was coming out of [employer] in [place] twisted the knee presents with 7 out of 10 pain swelling stiffness left knee. Initial treatment immobilizer crutches and oral pain medication. She eventually had an MRI because the knee did not get better. The knee on MRI showed probable tear of the anterior horn medial meniscus and artifact from the previous surgery. She will have arthroscopic surgery left knee secondary ongoing pain with specific attention paid to the anterior horn medial meniscus       Past Medical History  Diagnosis Date  . Arthritis    . Hypertension              Past Surgical History  Procedure Laterality Date  . Knee surgery          right and left.  . Tubal ligation          No arthritis or heart disease in family history  Social History:  reports that she has been passively smoking.  She does not have any smokeless tobacco history on file. She reports that she drinks alcohol. She reports that she does not use illicit drugs.   Allergies:       Allergies  Allergen Reactions  . Lisinopril Hives and Swelling      Swelling to throat, lips.      No prescriptions prior to admission      Lab Results Last 48 Hours  No results found for this or any previous visit (from the past 48 hour(s)).   Imaging Results (Last 48 hours)  No results found.     Review of Systems  Constitutional: Negative for fever, chills and weight loss.  Eyes: Negative for blurred vision.  Musculoskeletal: Negative for myalgias.  Neurological: Negative for seizures.  Endo/Heme/Allergies: Negative for environmental allergies and polydipsia. Does not bruise/bleed easily.  Psychiatric/Behavioral: Negative for hallucinations.  All other systems reviewed and  are negative.     There were no vitals taken for this visit. Physical Exam  BP 132/92 mmHg  Pulse 78  Temp(Src) 97.9 F (36.6 C) (Oral)  Resp 14  SpO2 96%   Gen. appearance patient is well developed well groomed on nourishment normal hygiene normal Oriented 3 Mood affect normal She has a slight favoring the left leg     The upper extremities show no contracture subluxation atrophy or tremor skin normal pulses good lymph nodes negative and normal sensation (right and left upper extremity)   On the left knee trephine anteromedial joint line tenderness small effusion knee flexion approximately 125. No instability. Strength normal. Skin normal. Pulses normal. Sensation normal. She has a mildly positive McMurray sign.   The she has no pathologic reflexes in the upper lower extremity she has normal balance and coordination   She has no lymphadenopathy     Assessment/Plan MRI shows probable tear anteromedial horn medial meniscus     IMPRESSION: 1. Postsurgical changes from previous patellofemoral arthroplasty and patellar realignment procedure. 2. Suspected degenerative tear of the anterior horn and body of the medial meniscus. 3. The lateral meniscus, cruciate and collateral ligaments appear intact. 4. 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     Electronically Signed   By: Carey Bullocks M.D.   On: 04/21/2015 09:29   Torn  medial meniscus left knee   Arthroscopy left knee partial medial meniscectomy   Fuller CanadaStanley Kalifa Cadden, MD 06/10/2015, 5:47 PM   Summary of operative report Preop diagnosis torn medial meniscus left knee Postop diagnosis same Procedure arthroscopy left knee with partial medial meniscectomy Surgical findings anterior horn medial meniscal tear  Chondrosis medial femoral condyle medial tibial plateau  Degenerative PCL, normal anterior cruciate ligament  Normal lateral compartment  Normal tracking of the artificial  patellofemoral joint and normal appearance of the implant Surgeon was Romeo AppleHarrison  Most recent examination left knee Physical Exam   Jodi LoserRhonda is a well-developed well-nourished well-groomed healthy-appearing African-American female with mesomorphic body habitus. She is oriented 3 her mood is pleasant her affect is happy her gait is normal she has tenderness in the anterior medial joint line without effusion she has a midline scar from her patellofemoral replacement which healed nicely with negative Tinel's throughout the incision line. Her range of motion is  limited to approximately 105 with 10 lack of extension, the knee remains stable including the patellofemoral joint , do not detect that she has  quadriceps weakness.   X-ray was obtained on the lateral x-ray you can see in the anterior tibia there is a large osteophyte/bone spur she has joint space narrowing medial compartment   Impression osteoarthritis left knee Status post patella replacement   Plan intra-articular cortisone injection   Permanent partial impairment 10% for arthroscopy of the knee, 5% for removal of part of the anterior meniscus. I did not add anything for the prior surgical intervention regarding the patellofemoral joint and the pre-injury arthritis  Total PPI 15% left knee  07/12/2016 1:44 PM Fuller CanadaStanley Perez Dirico, MD

## 2016-07-12 NOTE — Patient Instructions (Signed)
Jodi Gray  07/12/2016     @PREFPERIOPPHARMACY @   Your procedure is scheduled on  07/28/2016  [facility] to [facility] at  [facility]  [facility].M.  Call this number if you have problems the morning of surgery:  [phone]   Remember:  Do not eat food or drink liquids after midnight.  Take these medicines the morning of surgery with A SIP OF WATER  Ziac, norco, diclofenac.   Do not wear jewelry, make-up or nail polish.  Do not wear lotions, powders, or perfumes, or deoderant.  Do not shave 48 hours prior to surgery.  Men may shave face and neck.  Do not bring valuables to the hospital.  [facility] is not responsible for any belongings or valuables.  Contacts, dentures or bridgework may not be worn into surgery.  Leave your suitcase in the car.  After surgery it may be brought to your room.  For patients admitted to the hospital, discharge time will be determined by your treatment team.  Patients discharged the day of surgery will not be allowed to drive home.   Name and phone number of your driver:  family Special instructions:  None  Please read over the following fact sheets that you were given. Anesthesia Post-op Instructions and Care and Recovery After Surgery      Knee Arthroscopy Knee arthroscopy is a surgical procedure that is used to examine the inside of your knee joint and repair any damage. The surgeon puts a small, lighted instrument with a camera on the tip (arthroscope) through a small incision in your knee. The camera sends pictures to a monitor in the operating room. Your surgeon uses those pictures to guide the surgical instruments through other incisions to the area of damage. Knee arthroscopy can be used to treat many types of knee problems. It may be used:  To repair a torn ligament.  To repair or remove damaged tissue.  To remove a fluid-filled sac (cyst) from your knee.  Tell a health care provider about:  Any allergies you have.  All  medicines you are taking, including vitamins, herbs, eye drops, creams, and over-the-counter medicines.  Any problems you or family members have had with anesthetic medicines.  Any blood disorders you have.  Any surgeries you have had.  Any medical conditions you have. What are the risks? Generally, this is a safe procedure. However, problems may occur, including:  Infection.  Bleeding.  Damage to blood vessels, nerves, or structures of your knee.  A blood clot that forms in your leg and travels to your lung.  Failure to relieve symptoms.  What happens before the procedure?  Ask your health care provider about: ? Changing or stopping your regular medicines. This is especially important if you are taking diabetes medicines or blood thinners. ? Taking medicines such as aspirin and ibuprofen. These medicines can thin your blood. Do not take these medicines before your procedure if your health care provider instructs you not to.  Follow your health care provider's instructions about eating or drinking restrictions.  Plan to have someone take you home after the procedure.  If you go home right after the procedure, plan to have someone with you for 24 hours.  Do not drink alcohol unless your health care provider says that you can.  Do not use any tobacco products, including cigarettes, chewing tobacco, or electronic cigarettes unless your health care provider says that you can. If you  need help quitting, ask your health care provider.  You may have a physical exam. What happens during the procedure?  An IV tube will be inserted into one of your veins.  You will be given one or more of the following: ? A medicine that helps you relax (sedative). ? A medicine that numbs the area (local anesthetic). ? A medicine that makes you fall asleep (general anesthetic). ? A medicine that is injected into your spine that numbs the area below and slightly above the injection site (spinal  anesthetic). ? A medicine that is injected into an area of your body that numbs everything below the injection site (regional anesthetic).  A cuff may be placed around your upper leg to slow bleeding during the procedure.  The surgeon will make a small number of incisions around your knee.  Your knee joint will be flushed and filled with a germ-free (sterile) solution.  The arthroscope will be passed through an incision into your knee joint.  More instruments will be passed through other incisions to repair your knee as needed.  The fluid will be removed from your knee.  The incisions will be closed with adhesive strips or stitches (sutures).  A bandage (dressing) will be placed over your knee. The procedure may vary among health care providers and hospitals. What happens after the procedure?  Your blood pressure, heart rate, breathing rate and blood oxygen level will be monitored often until the medicines you were given have worn off.  You may be given medicine for pain.  You may get crutches to help you walk without using your knee to support your body weight.  You may have to wear compression stockings. These stocking help to prevent blood clots and reduce swelling in your legs. This information is not intended to replace advice given to you by your health care provider. Make sure you discuss any questions you have with your health care provider. Document Released: 12/25/1999 Document Revised: 06/04/2015 Document Reviewed: 12/23/2013 Elsevier Interactive Patient Education  2017 [employer] Knee Arthroscopy, Care After Refer to this sheet in the next few weeks. These instructions provide you with information about caring for yourself after your procedure. Your health care provider may also give you more specific instructions. Your treatment has been planned according to current medical practices, but problems sometimes occur. Call your health care provider if you have any  problems or questions after your procedure. What can I expect after the procedure? After the procedure, it is common to have:  Soreness.  Pain.  Follow these instructions at home: Bathing  Do not take baths, swim, or use a hot tub until your health care provider approves. Incision care  There are many different ways to close and cover an incision, including stitches, skin glue, and adhesive strips. Follow your health care provider's instructions about: ? Incision care. ? Bandage (dressing) changes and removal. ? Incision closure removal.  Check your incision area every day for signs of infection. Watch for: ? Redness, swelling, or pain. ? Fluid, blood, or pus. Activity  Avoid strenuous activities for as long as directed by your health care provider.  Return to your normal activities as directed by your health care provider. Ask your health care provider what activities are safe for you.  Perform range-of-motion exercises only as directed by your health care provider.  Do not lift anything that is heavier than 10 lb (4.5 kg).  Do not drive or operate heavy machinery while taking pain medicine.  If you were given crutches, use them as directed by your health care provider. Managing pain, stiffness, and swelling  If directed, apply ice to the injured area: ? Put ice in a plastic bag. ? Place a towel between your skin and the bag. ? Leave the ice on for 20 minutes, 2-3 times per day.  Raise the injured area above the level of your heart while you are sitting or lying down as directed by your health care provider. General instructions  Keep all follow-up visits as directed by your health care provider. This is important.  Take medicines only as directed by your health care provider.  Do not use any tobacco products, including cigarettes, chewing tobacco, or electronic cigarettes. If you need help quitting, ask your health care provider.  If you were given compression  stockings, wear them as directed by your health care provider. These stockings help prevent blood clots and reduce swelling in your legs. Contact a health care provider if:  You have severe pain with any movement of your knee.  You notice a bad smell coming from the incision or dressing.  You have redness, swelling, or pain at the site of your incision.  You have fluid, blood, or pus coming from your incision. Get help right away if:  You develop a rash.  You have a fever.  You have difficulty breathing or have shortness of breath.  You develop pain in your calves or in the back of your knee.  You develop chest pain.  You develop numbness or tingling in your leg or foot. This information is not intended to replace advice given to you by your health care provider. Make sure you discuss any questions you have with your health care provider. Document Released: 07/16/2004 Document Revised: 05/29/2015 Document Reviewed: 12/23/2013 Elsevier Interactive Patient Education  2017 Elsevier Inc.  General Anesthesia, Adult General anesthesia is the use of medicines to make a person "go to sleep" (be unconscious) for a medical procedure. General anesthesia is often recommended when a procedure:  Is long.  Requires you to be still or in an unusual position.  Is major and can cause you to lose blood.  Is impossible to do without general anesthesia.  The medicines used for general anesthesia are called general anesthetics. In addition to making you sleep, the medicines:  Prevent pain.  Control your blood pressure.  Relax your muscles.  Tell a health care provider about:  Any allergies you have.  All medicines you are taking, including vitamins, herbs, eye drops, creams, and over-the-counter medicines.  Any problems you or family members have had with anesthetic medicines.  Types of anesthetics you have had in the past.  Any bleeding disorders you have.  Any surgeries you  have had.  Any medical conditions you have.  Any history of heart or lung conditions, such as heart failure, sleep apnea, or chronic obstructive pulmonary disease (COPD).  Whether you are pregnant or may be pregnant.  Whether you use tobacco, alcohol, marijuana, or street drugs.  Any history of Financial planner.  Any history of depression or anxiety. What are the risks? Generally, this is a safe procedure. However, problems may occur, including:  Allergic reaction to anesthetics.  Lung and heart problems.  Inhaling food or liquids from your stomach into your lungs (aspiration).  Injury to nerves.  Waking up during your procedure and being unable to move (rare).  Extreme agitation or a state of mental confusion (delirium) when you wake up from  the anesthetic.  Air in the bloodstream, which can lead to stroke.  These problems are more likely to develop if you are having a major surgery or if you have an advanced medical condition. You can prevent some of these complications by answering all of your health care provider's questions thoroughly and by following all pre-procedure instructions. General anesthesia can cause side effects, including:  Nausea or vomiting  A sore throat from the breathing tube.  Feeling cold or shivery.  Feeling tired, washed out, or achy.  Sleepiness or drowsiness.  Confusion or agitation.  What happens before the procedure? Staying hydrated Follow instructions from your health care provider about hydration, which may include:  Up to 2 hours before the procedure - you may continue to drink clear liquids, such as water, clear fruit juice, black coffee, and plain tea.  Eating and drinking restrictions Follow instructions from your health care provider about eating and drinking, which may include:  8 hours before the procedure - stop eating heavy meals or foods such as meat, fried foods, or fatty foods.  6 hours before the procedure - stop  eating light meals or foods, such as toast or cereal.  6 hours before the procedure - stop drinking milk or drinks that contain milk.  2 hours before the procedure - stop drinking clear liquids.  Medicines  Ask your health care provider about: ? Changing or stopping your regular medicines. This is especially important if you are taking diabetes medicines or blood thinners. ? Taking medicines such as aspirin and ibuprofen. These medicines can thin your blood. Do not take these medicines before your procedure if your health care provider instructs you not to. ? Taking new dietary supplements or medicines. Do not take these during the week before your procedure unless your health care provider approves them.  If you are told to take a medicine or to continue taking a medicine on the day of the procedure, take the medicine with sips of water. General instructions   Ask if you will be going home the same day, the following day, or after a longer hospital stay. ? Plan to have someone take you home. ? Plan to have someone stay with you for the first 24 hours after you leave the hospital or clinic.  For 3-6 weeks before the procedure, try not to use any tobacco products, such as cigarettes, chewing tobacco, and e-cigarettes.  You may brush your teeth on the morning of the procedure, but make sure to spit out the toothpaste. What happens during the procedure?  You will be given anesthetics through a mask and through an IV tube in one of your veins.  You may receive medicine to help you relax (sedative).  As soon as you are asleep, a breathing tube may be used to help you breathe.  An anesthesia specialist will stay with you throughout the procedure. He or she will help keep you comfortable and safe by continuing to give you medicines and adjusting the amount of medicine that you get. He or she will also watch your blood pressure, pulse, and oxygen levels to make sure that the anesthetics do  not cause any problems.  If a breathing tube was used to help you breathe, it will be removed before you wake up. The procedure may vary among health care providers and hospitals. What happens after the procedure?  You will wake up, often slowly, after the procedure is complete, usually in a recovery area.  Your blood pressure,  heart rate, breathing rate, and blood oxygen level will be monitored until the medicines you were given have worn off.  You may be given medicine to help you calm down if you feel anxious or agitated.  If you will be going home the same day, your health care provider may check to make sure you can stand, drink, and urinate.  Your health care providers will treat your pain and side effects before you go home.  Do not drive for 24 hours if you received a sedative.  You may: ? Feel nauseous and vomit. ? Have a sore throat. ? Have mental slowness. ? Feel cold or shivery. ? Feel sleepy. ? Feel tired. ? Feel sore or achy, even in parts of your body where you did not have surgery. This information is not intended to replace advice given to you by your health care provider. Make sure you discuss any questions you have with your health care provider. Document Released: 04/05/2007 Document Revised: 06/09/2015 Document Reviewed: 12/11/2014 Elsevier Interactive Patient Education  2018 [employer]. General Anesthesia, Adult, Care After These instructions provide you with information about caring for yourself after your procedure. Your health care provider may also give you more specific instructions. Your treatment has been planned according to current medical practices, but problems sometimes occur. Call your health care provider if you have any problems or questions after your procedure. What can I expect after the procedure? After the procedure, it is common to have:  Vomiting.  A sore throat.  Mental slowness.  It is common to feel:  Nauseous.  Cold or  shivery.  Sleepy.  Tired.  Sore or achy, even in parts of your body where you did not have surgery.  Follow these instructions at home: For at least 24 hours after the procedure:  Do not: ? Participate in activities where you could fall or become injured. ? Drive. ? Use heavy machinery. ? Drink alcohol. ? Take sleeping pills or medicines that cause drowsiness. ? Make important decisions or sign legal documents. ? Take care of children on your own.  Rest. Eating and drinking  If you vomit, drink water, juice, or soup when you can drink without vomiting.  Drink enough fluid to keep your urine clear or pale yellow.  Make sure you have little or no nausea before eating solid foods.  Follow the diet recommended by your health care provider. General instructions  Have a responsible adult stay with you until you are awake and alert.  Return to your normal activities as told by your health care provider. Ask your health care provider what activities are safe for you.  Take over-the-counter and prescription medicines only as told by your health care provider.  If you smoke, do not smoke without supervision.  Keep all follow-up visits as told by your health care provider. This is important. Contact a health care provider if:  You continue to have nausea or vomiting at home, and medicines are not helpful.  You cannot drink fluids or start eating again.  You cannot urinate after 8-12 hours.  You develop a skin rash.  You have fever.  You have increasing redness at the site of your procedure. Get help right away if:  You have difficulty breathing.  You have chest pain.  You have unexpected bleeding.  You feel that you are having a life-threatening or urgent problem. This information is not intended to replace advice given to you by your health care provider. Make sure you  discuss any questions you have with your health care provider. Document Released: 04/04/2000  Document Revised: 06/01/2015 Document Reviewed: 12/11/2014 Elsevier Interactive Patient Education  [employer].

## 2016-07-19 ENCOUNTER — Encounter: Payer: Self-pay | Admitting: *Deleted

## 2016-07-19 NOTE — Progress Notes (Signed)
PER PATIENT PLAN SURGICAL PRE AUTH NOT REQUIRED FOR CPT 29881 REFERENCE [id number]

## 2016-07-25 ENCOUNTER — Encounter (HOSPITAL_COMMUNITY): Payer: Self-pay

## 2016-07-25 ENCOUNTER — Other Ambulatory Visit: Payer: Self-pay

## 2016-07-25 ENCOUNTER — Encounter (HOSPITAL_COMMUNITY)
Admission: RE | Admit: 2016-07-25 | Discharge: 2016-07-25 | Disposition: A | Discharge status: Home / Self Care | Payer: BLUE CROSS/BLUE SHIELD | Source: Ambulatory Visit | Attending: Orthopedic Surgery | Admitting: Orthopedic Surgery

## 2016-07-25 DIAGNOSIS — Y939 Activity, unspecified: Secondary | ICD-10-CM | POA: Diagnosis not present

## 2016-07-25 DIAGNOSIS — Y929 Unspecified place or not applicable: Secondary | ICD-10-CM | POA: Diagnosis not present

## 2016-07-25 DIAGNOSIS — M23203 Derangement of unspecified medial meniscus due to old tear or injury, right knee: Secondary | ICD-10-CM | POA: Diagnosis not present

## 2016-07-25 DIAGNOSIS — M1711 Unilateral primary osteoarthritis, right knee: Secondary | ICD-10-CM | POA: Diagnosis present

## 2016-07-25 DIAGNOSIS — Z79899 Other long term (current) drug therapy: Secondary | ICD-10-CM | POA: Diagnosis not present

## 2016-07-25 DIAGNOSIS — Z7722 Contact with and (suspected) exposure to environmental tobacco smoke (acute) (chronic): Secondary | ICD-10-CM | POA: Diagnosis not present

## 2016-07-25 DIAGNOSIS — I10 Essential (primary) hypertension: Secondary | ICD-10-CM | POA: Diagnosis not present

## 2016-07-25 DIAGNOSIS — X58XXXA Exposure to other specified factors, initial encounter: Secondary | ICD-10-CM | POA: Diagnosis not present

## 2016-07-25 LAB — BASIC METABOLIC PANEL
Anion gap: 5 (ref 5–15)
BUN: 11 mg/dL (ref 6–20)
CALCIUM: 8.4 mg/dL — AB (ref 8.9–10.3)
CHLORIDE: 107 mmol/L (ref 101–111)
CO2: 26 mmol/L (ref 22–32)
CREATININE: 0.56 mg/dL (ref 0.44–1.00)
GFR calc Af Amer: >60 mL/min (ref >60)
GFR calc non Af Amer: >60 mL/min (ref >60)
Glucose, Bld: 97 mg/dL (ref 65–99)
Potassium: 3.8 mmol/L (ref 3.5–5.1)
SODIUM: 138 mmol/L (ref 135–145)

## 2016-07-25 LAB — SURGICAL PCR SCREEN
MRSA, PCR: NEGATIVE
Staphylococcus aureus: NEGATIVE

## 2016-07-25 LAB — CBC
HCT: 31.9 % — ABNORMAL LOW (ref 36.0–46.0)
HEMOGLOBIN: 11.2 g/dL — AB (ref 12.0–15.0)
MCH: 27.1 pg (ref 26.0–34.0)
MCHC: 35.1 g/dL (ref 30.0–36.0)
MCV: 77.2 fL — ABNORMAL LOW (ref 78.0–100.0)
PLATELETS: 297 10*3/uL (ref 150–400)
RBC: 4.13 MIL/uL (ref 3.87–5.11)
RDW: 15 % (ref 11.5–15.5)
WBC: 5.5 10*3/uL (ref 4.0–10.5)

## 2016-07-25 LAB — HCG, SERUM, QUALITATIVE: Preg, Serum: NEGATIVE

## 2016-07-27 NOTE — H&P (Signed)
Chief complaint pain right knee with prior MRI   46 year old female had some type of Houser procedure for dislocating patella back in 1997 at another facility comes in with MRI follow-up after persistent pain giving way in her right knee     Review of Systems  Constitutional: Negative for chills, fever and weight loss.  Respiratory: Negative for shortness of breath.   Cardiovascular: Negative for chest pain.  Neurological: Negative for tingling.      Past Medical History:  Diagnosis Date  . Arthritis   . Hypertension    Past Surgical History:  Procedure Laterality Date  . KNEE ARTHROSCOPY WITH MEDIAL MENISECTOMY Left 06/11/2015   Procedure: KNEE ARTHROSCOPY WITH MEDIAL MENISECTOMY;  Surgeon: Vickki Hearing, MD;  Location: [facility];  Service: Orthopedics;  Laterality: Left;  . KNEE SURGERY     right and left.  . TUBAL LIGATION     Social History  Substance Use Topics  . Smoking status: Passive Smoke Exposure - Never Smoker  . Smokeless tobacco: Never Used  . Alcohol use Yes     Comment: occasionally   No family history on file. No current facility-administered medications for this encounter.   Current Outpatient Prescriptions:  .  bisoprolol-hydrochlorothiazide (ZIAC) 2.5-6.25 MG tablet, Take 1 tablet by mouth daily., Disp: , Rfl: 1 .  cetirizine (ZYRTEC) 10 MG tablet, Take 10 mg by mouth daily as needed for allergies., Disp: , Rfl:  .  HYDROcodone-acetaminophen (NORCO) 7.5-325 MG tablet, TAKE 1 TABLET BY MOUTH EVERY 8 HOURS AS NEEDED FOR MODERATE PAIN, Disp: , Rfl: 0 .  ibuprofen (ADVIL,MOTRIN) 800 MG tablet, Take 1 tablet (800 mg total) by mouth every 8 (eight) hours as needed. (Patient taking differently: Take 800 mg by mouth every 8 (eight) hours as needed for mild pain or moderate pain. ), Disp: 30 tablet, Rfl: 0 .  topiramate (TOPAMAX) 50 MG tablet, TAKE ONE TABLET (50 MG) BY ORAL ROUTE TWO TIMES PER DAY, Disp: , Rfl: 0 .  diclofenac (CATAFLAM) 50 MG tablet, Take 1  tablet (50 mg total) by mouth 2 (two) times daily. (Patient not taking: Reported on 07/20/2016), Disp: 90 tablet, Rfl: 3 .  Sodium Hyaluronate (SUPARTZ FX) 25 MG/2.5ML SOSY, Inject one syringe into left knee one weekly for three weeks (Patient not taking: Reported on 07/20/2016), Disp: 3 Syringe, Rfl: 0    EXAMINATION:  Gen. appearance is normal grooming and hygiene Orientation to person place and time normal Mood normal Gait is normal    Sensory exam shows normal sensation to palpation, pressure and soft touch Skin exam no lacerations ulcerations or erythema   Ortho Exam   As we noted before her knee Seems to sit sideways she has medial joint line tenderness flexion is limited to about 105 this is chronic. 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 Severe attenuation of the medial meniscus most consistent with prior extensive meniscectomy and/or new tear. No prior MRI is available for direct comparison. 2. Marrow edema in the periphery of the anterolateral femoral condyle with lateral patellar tilting and lateralization of the patellar tendon insertion relative to the trochlear groove. Overall findings are concerning for remote transient lateral patellar dislocation. 3. High-grade partial-thickness cartilage loss with areas of full-thickness cartilage loss of the medial and lateral patellar  facets.     Electronically Signed   By: Elige KoHetal  Patel   On: 06/08/2016 08:35     This is a very difficult case. 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

## 2016-07-28 ENCOUNTER — Ambulatory Visit (HOSPITAL_COMMUNITY): Payer: BLUE CROSS/BLUE SHIELD | Admitting: Anesthesiology

## 2016-07-28 ENCOUNTER — Telehealth: Payer: Self-pay | Admitting: Orthopedic Surgery

## 2016-07-28 ENCOUNTER — Ambulatory Visit (HOSPITAL_COMMUNITY)
Admission: RE | Admit: 2016-07-28 | Discharge: 2016-07-28 | Disposition: A | Discharge status: Home / Self Care | Payer: BLUE CROSS/BLUE SHIELD | Source: Ambulatory Visit | Attending: Orthopedic Surgery | Admitting: Orthopedic Surgery

## 2016-07-28 ENCOUNTER — Encounter (HOSPITAL_COMMUNITY)
Admission: RE | Disposition: A | Discharge status: Home / Self Care | Payer: Self-pay | Source: Ambulatory Visit | Attending: Orthopedic Surgery

## 2016-07-28 DIAGNOSIS — M23203 Derangement of unspecified medial meniscus due to old tear or injury, right knee: Secondary | ICD-10-CM | POA: Insufficient documentation

## 2016-07-28 DIAGNOSIS — M23231 Derangement of other medial meniscus due to old tear or injury, right knee: Secondary | ICD-10-CM

## 2016-07-28 DIAGNOSIS — Y939 Activity, unspecified: Secondary | ICD-10-CM | POA: Insufficient documentation

## 2016-07-28 DIAGNOSIS — X58XXXA Exposure to other specified factors, initial encounter: Secondary | ICD-10-CM | POA: Insufficient documentation

## 2016-07-28 DIAGNOSIS — Z7722 Contact with and (suspected) exposure to environmental tobacco smoke (acute) (chronic): Secondary | ICD-10-CM | POA: Insufficient documentation

## 2016-07-28 DIAGNOSIS — I10 Essential (primary) hypertension: Secondary | ICD-10-CM | POA: Insufficient documentation

## 2016-07-28 DIAGNOSIS — Z79899 Other long term (current) drug therapy: Secondary | ICD-10-CM | POA: Insufficient documentation

## 2016-07-28 DIAGNOSIS — M1711 Unilateral primary osteoarthritis, right knee: Secondary | ICD-10-CM | POA: Insufficient documentation

## 2016-07-28 DIAGNOSIS — Y929 Unspecified place or not applicable: Secondary | ICD-10-CM | POA: Insufficient documentation

## 2016-07-28 HISTORY — PX: KNEE ARTHROSCOPY: SHX127

## 2016-07-28 SURGERY — ARTHROSCOPY, KNEE
Anesthesia: General | Site: Knee | Laterality: Right

## 2016-07-28 MED ORDER — HYDROCODONE-ACETAMINOPHEN 7.5-325 MG PO TABS: 1.0000 | ORAL_TABLET | ORAL | 0 refills | Status: DC | PRN

## 2016-07-28 MED ORDER — MIDAZOLAM HCL 2 MG/2ML IJ SOLN
INTRAMUSCULAR | Status: AC
  Filled 2016-07-28: qty 2

## 2016-07-28 MED ORDER — LIDOCAINE HCL (CARDIAC) 10 MG/ML IV SOLN
INTRAVENOUS | Status: DC | PRN
  Administered 2016-07-28: 50 mg via INTRAVENOUS

## 2016-07-28 MED ORDER — PROPOFOL 10 MG/ML IV BOLUS
INTRAVENOUS | Status: DC | PRN
  Administered 2016-07-28: 150 mg via INTRAVENOUS

## 2016-07-28 MED ORDER — SODIUM CHLORIDE 0.9 % IR SOLN
Status: DC | PRN
  Administered 2016-07-28: 1000 mL

## 2016-07-28 MED ORDER — BUPIVACAINE-EPINEPHRINE (PF) 0.5% -1:200000 IJ SOLN
INTRAMUSCULAR | Status: AC
  Filled 2016-07-28: qty 60

## 2016-07-28 MED ORDER — IBUPROFEN 800 MG PO TABS
ORAL_TABLET | ORAL | Status: AC
  Filled 2016-07-28: qty 1

## 2016-07-28 MED ORDER — ONDANSETRON HCL 4 MG/2ML IJ SOLN
4.0000 mg | Freq: Once | INTRAMUSCULAR | Status: AC
  Administered 2016-07-28: 4 mg via INTRAVENOUS

## 2016-07-28 MED ORDER — EPINEPHRINE PF 1 MG/ML IJ SOLN
INTRAMUSCULAR | Status: DC | PRN
  Administered 2016-07-28 (×3): 3000 mL

## 2016-07-28 MED ORDER — KETOROLAC TROMETHAMINE 30 MG/ML IJ SOLN
30.0000 mg | Freq: Once | INTRAMUSCULAR | Status: AC
  Administered 2016-07-28: 30 mg via INTRAVENOUS

## 2016-07-28 MED ORDER — BUPIVACAINE-EPINEPHRINE (PF) 0.5% -1:200000 IJ SOLN
INTRAMUSCULAR | Status: DC | PRN
  Administered 2016-07-28: 60 mL via PERINEURAL

## 2016-07-28 MED ORDER — HYDROCODONE-ACETAMINOPHEN 7.5-325 MG PO TABS
1.0000 | ORAL_TABLET | Freq: Once | ORAL | Status: AC
  Administered 2016-07-28: 1 via ORAL

## 2016-07-28 MED ORDER — ONDANSETRON HCL 4 MG/2ML IJ SOLN
INTRAMUSCULAR | Status: AC
  Filled 2016-07-28: qty 2

## 2016-07-28 MED ORDER — MIDAZOLAM HCL 2 MG/2ML IJ SOLN
1.0000 mg | INTRAMUSCULAR | Status: AC
  Administered 2016-07-28: 2 mg via INTRAVENOUS

## 2016-07-28 MED ORDER — CHLORHEXIDINE GLUCONATE 4 % EX LIQD: 60.0000 mL | Freq: Once | CUTANEOUS | Status: DC

## 2016-07-28 MED ORDER — HYDROCODONE-ACETAMINOPHEN 7.5-325 MG PO TABS
ORAL_TABLET | ORAL | Status: AC
  Filled 2016-07-28: qty 1

## 2016-07-28 MED ORDER — FENTANYL CITRATE (PF) 250 MCG/5ML IJ SOLN
INTRAMUSCULAR | Status: AC
  Filled 2016-07-28: qty 5

## 2016-07-28 MED ORDER — FENTANYL CITRATE (PF) 100 MCG/2ML IJ SOLN
INTRAMUSCULAR | Status: DC | PRN
  Administered 2016-07-28: 50 ug via INTRAVENOUS

## 2016-07-28 MED ORDER — PROPOFOL 10 MG/ML IV BOLUS
INTRAVENOUS | Status: AC
  Filled 2016-07-28: qty 60

## 2016-07-28 MED ORDER — EPINEPHRINE PF 1 MG/ML IJ SOLN
INTRAMUSCULAR | Status: AC
  Filled 2016-07-28: qty 5

## 2016-07-28 MED ORDER — FENTANYL CITRATE (PF) 100 MCG/2ML IJ SOLN
25.0000 ug | INTRAMUSCULAR | Status: DC | PRN
Start: 2016-07-28 — End: 2016-07-28
  Administered 2016-07-28 (×2): 50 ug via INTRAVENOUS
  Filled 2016-07-28: qty 2

## 2016-07-28 MED ORDER — CEFAZOLIN SODIUM-DEXTROSE 2-4 GM/100ML-% IV SOLN
2.0000 g | INTRAVENOUS | Status: AC
  Administered 2016-07-28: 2 g via INTRAVENOUS
  Filled 2016-07-28: qty 100

## 2016-07-28 MED ORDER — LACTATED RINGERS IV SOLN
INTRAVENOUS | Status: DC
  Administered 2016-07-28: 07:00:00 via INTRAVENOUS

## 2016-07-28 MED ORDER — IBUPROFEN 800 MG PO TABS
800.0000 mg | ORAL_TABLET | Freq: Once | ORAL | Status: AC
  Administered 2016-07-28: 800 mg via ORAL

## 2016-07-28 MED ORDER — LIDOCAINE HCL (PF) 1 % IJ SOLN
INTRAMUSCULAR | Status: AC
  Filled 2016-07-28: qty 5

## 2016-07-28 MED ORDER — IBUPROFEN 800 MG PO TABS: 800.0000 mg | ORAL_TABLET | Freq: Three times a day (TID) | ORAL | 1 refills | Status: DC | PRN

## 2016-07-28 MED ORDER — KETOROLAC TROMETHAMINE 30 MG/ML IJ SOLN
INTRAMUSCULAR | Status: AC
  Filled 2016-07-28: qty 1

## 2016-07-28 SURGICAL SUPPLY — 56 items
ARTHROWAND PARAGON T2 (SURGICAL WAND)
BAG HAMPER (MISCELLANEOUS) ×3 IMPLANT
BANDAGE ELASTIC 6 VELCRO NS (GAUZE/BANDAGES/DRESSINGS) ×3 IMPLANT
BANDAGE ELASTIC 6 VELCRO ST LF (GAUZE/BANDAGES/DRESSINGS) ×2 IMPLANT
BLADE AGGRESSIVE PLUS 4.0 (BLADE) ×3 IMPLANT
BLADE SURG SZ11 CARB STEEL (BLADE) ×3 IMPLANT
CHLORAPREP W/TINT 26ML (MISCELLANEOUS) ×3 IMPLANT
CLOTH BEACON ORANGE TIMEOUT ST (SAFETY) ×3 IMPLANT
COOLER CRYO IC GRAV AND TUBE (ORTHOPEDIC SUPPLIES) ×3 IMPLANT
COVER PROBE W GEL 5X96 (DRAPES) ×3 IMPLANT
CUFF CRYO KNEE LG 20X31 COOLER (ORTHOPEDIC SUPPLIES) ×3 IMPLANT
CUFF TOURNIQUET SINGLE 34IN LL (TOURNIQUET CUFF) ×2 IMPLANT
CUTTER ANGLED DBL BITE 4.5 (BURR) IMPLANT
DECANTER SPIKE VIAL GLASS SM (MISCELLANEOUS) ×6 IMPLANT
GAUZE SPONGE 4X4 12PLY STRL (GAUZE/BANDAGES/DRESSINGS) ×2 IMPLANT
GAUZE SPONGE 4X4 16PLY XRAY LF (GAUZE/BANDAGES/DRESSINGS) ×3 IMPLANT
GAUZE XEROFORM 5X9 LF (GAUZE/BANDAGES/DRESSINGS) ×3 IMPLANT
GLOVE BIOGEL PI IND STRL 6.5 (GLOVE) IMPLANT
GLOVE BIOGEL PI IND STRL 7.0 (GLOVE) ×1 IMPLANT
GLOVE BIOGEL PI INDICATOR 6.5 (GLOVE) ×2
GLOVE BIOGEL PI INDICATOR 7.0 (GLOVE) ×2
GLOVE SKINSENSE NS SZ8.0 LF (GLOVE) ×2
GLOVE SKINSENSE STRL SZ8.0 LF (GLOVE) ×1 IMPLANT
GLOVE SS N UNI LF 8.5 STRL (GLOVE) ×3 IMPLANT
GOWN STRL REUS W/TWL LRG LVL3 (GOWN DISPOSABLE) ×3 IMPLANT
GOWN STRL REUS W/TWL XL LVL3 (GOWN DISPOSABLE) ×3 IMPLANT
HLDR LEG FOAM (MISCELLANEOUS) ×1 IMPLANT
IV NS IRRIG 3000ML ARTHROMATIC (IV SOLUTION) ×8 IMPLANT
KIT BLADEGUARD II DBL (SET/KITS/TRAYS/PACK) ×3 IMPLANT
KIT ROOM TURNOVER AP CYSTO (KITS) ×3 IMPLANT
LEG HOLDER FOAM (MISCELLANEOUS) ×2
MANIFOLD NEPTUNE II (INSTRUMENTS) ×3 IMPLANT
MARKER SKIN DUAL TIP RULER LAB (MISCELLANEOUS) ×3 IMPLANT
NDL HYPO 18GX1.5 BLUNT FILL (NEEDLE) ×1 IMPLANT
NDL HYPO 21X1.5 SAFETY (NEEDLE) ×1 IMPLANT
NDL SPNL 18GX3.5 QUINCKE PK (NEEDLE) ×1 IMPLANT
NEEDLE HYPO 18GX1.5 BLUNT FILL (NEEDLE) ×3 IMPLANT
NEEDLE HYPO 21X1.5 SAFETY (NEEDLE) ×3 IMPLANT
NEEDLE SPNL 18GX3.5 QUINCKE PK (NEEDLE) ×3 IMPLANT
NS IRRIG 1000ML POUR BTL (IV SOLUTION) ×3 IMPLANT
PACK ARTHRO LIMB DRAPE STRL (MISCELLANEOUS) ×3 IMPLANT
PAD ABD 5X9 TENDERSORB (GAUZE/BANDAGES/DRESSINGS) ×3 IMPLANT
PAD ARMBOARD 7.5X6 YLW CONV (MISCELLANEOUS) ×3 IMPLANT
PADDING CAST COTTON 6X4 STRL (CAST SUPPLIES) ×3 IMPLANT
PADDING WEBRIL 6 STERILE (GAUZE/BANDAGES/DRESSINGS) ×2 IMPLANT
PROBE BIPOLAR 50 DEGREE SUCT (MISCELLANEOUS) ×3 IMPLANT
PROBE BIPOLAR ATHRO 135MM 90D (MISCELLANEOUS) ×1 IMPLANT
SET ARTHROSCOPY INST (INSTRUMENTS) ×3 IMPLANT
SET ARTHROSCOPY PUMP TUBE (IRRIGATION / IRRIGATOR) ×3 IMPLANT
SET BASIN LINEN APH (SET/KITS/TRAYS/PACK) ×3 IMPLANT
SPONGE GAUZE 4X4 12PLY (GAUZE/BANDAGES/DRESSINGS) ×2 IMPLANT
SUT ETHILON 3 0 FSL (SUTURE) IMPLANT
SYR 30ML LL (SYRINGE) ×3 IMPLANT
TUBE CONNECTING 12'X1/4 (SUCTIONS) ×3
TUBE CONNECTING 12X1/4 (SUCTIONS) ×7 IMPLANT
WAND ARTHRO PARAGON T2 (SURGICAL WAND) IMPLANT

## 2016-07-28 NOTE — Anesthesia Procedure Notes (Signed)
Procedure Name: LMA Insertion Date/Time: 07/28/2016 7:29 AM Performed by: Franco NonesYATES, Jacia Sickman S Pre-anesthesia Checklist: Patient identified, Patient being monitored, Emergency Drugs available, Timeout performed and Suction available Patient Re-evaluated:Patient Re-evaluated prior to induction Oxygen Delivery Method: Circle System Utilized Preoxygenation: Pre-oxygenation with 100% oxygen Induction Type: IV induction Ventilation: Mask ventilation without difficulty LMA: LMA inserted LMA Size: 4.0 Number of attempts: 1 Placement Confirmation: positive ETCO2 and breath sounds checked- equal and bilateral Tube secured with: Tape Dental Injury: Teeth and Oropharynx as per pre-operative assessment

## 2016-07-28 NOTE — Brief Op Note (Signed)
07/28/2016  8:16 AM  PATIENT:  Jodi Gray  46 y.o. female  PRE-OPERATIVE DIAGNOSIS:  RIGHT KNEE ARTHRITIS, PATELLA DYSPLASIA  POST-OPERATIVE DIAGNOSIS:  torn medial meniscus, subluxation patella  PROCEDURE:  Procedure(s): ARTHROSCOPY KNEE partial medial meniscectomy (Right)   Operative findings Under anesthesia the patella was dislocated in extension and relocated at 30 flexion. Cruciate ligaments intact. Collateral ligaments intact.  Intraoperative findings normal medial compartment articular cartilage with small re-tear medial meniscus and small posterior horn remnant of old prior meniscectomy  Lateral compartment articular cartilage and meniscus normal.  Anterior cruciate ligament probe normal.  Trochlea centrally normal.  Lateral trochlea grade 4 chondral changes. The patella again sat in the lateral position in extension. The medial and lateral facets were arthritic grade 3 and 4.  Knee arthroscopy dictation  The patient was identified in the preoperative holding area using 2 approved identification mechanisms. The chart was reviewed and updated. The surgical site was confirmed as right knee and marked with an indelible marker.  The patient was taken to the operating room for anesthesia. After successful  general anesthesia, Ancef 2 g was used as IV antibiotics.  The patient was placed in the supine position with the (right leg) the operative extremity in an arthroscopic leg holder and the opposite extremity in a padded leg holder.  The timeout was executed.  A lateral portal was established with an 11 blade and the scope was introduced into the joint. A diagnostic arthroscopy was performed in circumferential manner examining the entire knee joint. A medial portal was established and the diagnostic arthroscopy was repeated using a probe to palpate intra-articular structures as they were encountered.    The medial meniscus was resected using a 50 arthroscopic wand, a  shaver was then used to balance the meniscus  The patella was observed to partially reengage with the trochlea but never fully reengage throughout all degrees of flexion.   The arthroscopic pump was placed on the wash mode and any excess debris was removed from the joint using suction.  60 cc of Marcaine with epinephrine was injected through the arthroscope.  The portals were closed with 3-0 nylon suture.  A sterile bandage, Ace wrap and Cryo/Cuff was placed and the Cryo/Cuff was activated. The patient was taken to the recovery room in stable condition.  SURGEON:  Surgeon(s) and Role:    * Vickki HearingHarrison, Alper Guilmette E, MD - Primary  PHYSICIAN ASSISTANT:   ASSISTANTS: none   ANESTHESIA:   general  EBL:  Total I/O In: 700 [I.V.:700] Out: 0   BLOOD ADMINISTERED:none  DRAINS: none   LOCAL MEDICATIONS USED:  MARCAINE     SPECIMEN:  No Specimen  DISPOSITION OF SPECIMEN:  N/A  COUNTS:  YES  TOURNIQUET:    DICTATION: .Dragon Dictation  PLAN OF CARE: Discharge to home after PACU  PATIENT DISPOSITION:  PACU - hemodynamically stable.   Delay start of Pharmacological VTE agent (>24hrs) due to surgical blood loss or risk of bleeding: na  29881

## 2016-07-28 NOTE — Telephone Encounter (Signed)
Patient called after hospital discharge today, 07/28/16, from out-patient surgery - [facility] will not fill the prescription for: HYDROcodone-acetaminophen (NORCO) 7.5-325 MG tablet 30 tablet   - states due to current pain medication prescribed by primary care; patient states was advised by pharmacist that she may be able to request a different strength or other pain medication.  Please advise.

## 2016-07-28 NOTE — Anesthesia Preprocedure Evaluation (Signed)
Anesthesia Evaluation  Patient identified by MRN, date of birth, ID band Patient awake    Reviewed: Allergy & Precautions, NPO status , Patient's Chart, lab work & pertinent test results  Airway Mallampati: I  TM Distance: >3 FB     Dental  (+) Teeth Intact   Pulmonary neg pulmonary ROS,    breath sounds clear to auscultation       Cardiovascular hypertension, Pt. on medications  Rhythm:Regular Rate:Normal     Neuro/Psych negative neurological ROS     GI/Hepatic negative GI ROS, Neg liver ROS,   Endo/Other  negative endocrine ROS  Renal/GU negative Renal ROS     Musculoskeletal   Abdominal   Peds  Hematology negative hematology ROS (+)   Anesthesia Other Findings   Reproductive/Obstetrics                             Anesthesia Physical Anesthesia Plan  ASA: II  Anesthesia Plan: General   Post-op Pain Management:    Induction: Intravenous  PONV Risk Score and Plan:   Airway Management Planned: LMA  Additional Equipment:   Intra-op Plan:   Post-operative Plan: Extubation in OR  Informed Consent: I have reviewed the patients History and Physical, chart, labs and discussed the procedure including the risks, benefits and alternatives for the proposed anesthesia with the patient or authorized representative who has indicated his/her understanding and acceptance.     Plan Discussed with:   Anesthesia Plan Comments:         Anesthesia Quick Evaluation

## 2016-07-28 NOTE — Interval H&P Note (Signed)
History and Physical Interval Note:  07/28/2016 7:11 AM  Jodi Gray  has presented today for surgery, with the diagnosis of RIGHT KNEE ARTHRITIS, PATELLA DYSPLASIA  The various methods of treatment have been discussed with the patient and family. After consideration of risks, benefits and other options for treatment, the patient has consented to  Procedure(s): ARTHROSCOPY KNEE and DEBRIDEMENT (Right) as a surgical intervention .  The patient's history has been reviewed, patient examined, no change in status, stable for surgery.  I have reviewed the patient's chart and labs.  Questions were answered to the patient's satisfaction.     Fuller CanadaStanley Harrison

## 2016-07-28 NOTE — Transfer of Care (Signed)
Immediate Anesthesia Transfer of Care Note  Patient: Tora KindredRhonda T Blanke  Procedure(s) Performed: Procedure(s): ARTHROSCOPY KNEE partial medial meniscectomy (Right)  Patient Location: PACU  Anesthesia Type:General  Level of Consciousness: awake and patient cooperative  Airway & Oxygen Therapy: Patient Spontanous Breathing and non-rebreather face mask  Post-op Assessment: Report given to RN and Post -op Vital signs reviewed and stable  Post vital signs: Reviewed and stable  Last Vitals:  Vitals:   07/28/16 0710 07/28/16 0720  BP: (!) 134/91   Pulse:    Resp: (!) 22 19  Temp:      Last Pain:  Vitals:   07/28/16 0645  TempSrc: Oral      Patients Stated Pain Goal: 5 (07/28/16 0645)  Complications: No apparent anesthesia complications

## 2016-07-28 NOTE — Telephone Encounter (Signed)
Routing to Dr Harrison 

## 2016-07-28 NOTE — Anesthesia Postprocedure Evaluation (Signed)
Anesthesia Post Note  Patient: Tora KindredRhonda T Teater  Procedure(s) Performed: Procedure(s) (LRB): ARTHROSCOPY KNEE partial medial meniscectomy (Right)  Patient location during evaluation: PACU Anesthesia Type: General Level of consciousness: awake and alert Pain management: satisfactory to patient Vital Signs Assessment: post-procedure vital signs reviewed and stable Respiratory status: spontaneous breathing Cardiovascular status: stable Postop Assessment: no signs of nausea or vomiting Anesthetic complications: no     Last Vitals:  Vitals:   07/28/16 0830 07/28/16 0845  BP: 122/85   Pulse: (!) 58 65  Resp: 14 18  Temp:      Last Pain:  Vitals:   07/28/16 0845  TempSrc:   PainSc: 6                  Ancelmo Hunt

## 2016-07-28 NOTE — Op Note (Signed)
07/28/2016  8:16 AM  PATIENT:  Jodi Gray  45 y.o. female  PRE-OPERATIVE DIAGNOSIS:  RIGHT KNEE ARTHRITIS, PATELLA DYSPLASIA  POST-OPERATIVE DIAGNOSIS:  torn medial meniscus, subluxation patella  PROCEDURE:  Procedure(s): ARTHROSCOPY KNEE partial medial meniscectomy (Right)   Operative findings Under anesthesia the patella was dislocated in extension and relocated at 30 flexion. Cruciate ligaments intact. Collateral ligaments intact.  Intraoperative findings normal medial compartment articular cartilage with small re-tear medial meniscus and small posterior horn remnant of old prior meniscectomy  Lateral compartment articular cartilage and meniscus normal.  Anterior cruciate ligament probe normal.  Trochlea centrally normal.  Lateral trochlea grade 4 chondral changes. The patella again sat in the lateral position in extension. The medial and lateral facets were arthritic grade 3 and 4.  Knee arthroscopy dictation  The patient was identified in the preoperative holding area using 2 approved identification mechanisms. The chart was reviewed and updated. The surgical site was confirmed as right knee and marked with an indelible marker.  The patient was taken to the operating room for anesthesia. After successful  general anesthesia, Ancef 2 g was used as IV antibiotics.  The patient was placed in the supine position with the (right leg) the operative extremity in an arthroscopic leg holder and the opposite extremity in a padded leg holder.  The timeout was executed.  A lateral portal was established with an 11 blade and the scope was introduced into the joint. A diagnostic arthroscopy was performed in circumferential manner examining the entire knee joint. A medial portal was established and the diagnostic arthroscopy was repeated using a probe to palpate intra-articular structures as they were encountered.    The medial meniscus was resected using a 50 arthroscopic wand, a  shaver was then used to balance the meniscus  The patella was observed to partially reengage with the trochlea but never fully reengage throughout all degrees of flexion.   The arthroscopic pump was placed on the wash mode and any excess debris was removed from the joint using suction.  60 cc of Marcaine with epinephrine was injected through the arthroscope.  The portals were closed with 3-0 nylon suture.  A sterile bandage, Ace wrap and Cryo/Cuff was placed and the Cryo/Cuff was activated. The patient was taken to the recovery room in stable condition.  SURGEON:  Surgeon(s) and Role:    * Malaka Ruffner E, MD - Primary  PHYSICIAN ASSISTANT:   ASSISTANTS: none   ANESTHESIA:   general  EBL:  Total I/O In: 700 [I.V.:700] Out: 0   BLOOD ADMINISTERED:none  DRAINS: none   LOCAL MEDICATIONS USED:  MARCAINE     SPECIMEN:  No Specimen  DISPOSITION OF SPECIMEN:  N/A  COUNTS:  YES  TOURNIQUET:    DICTATION: .Dragon Dictation  PLAN OF CARE: Discharge to home after PACU  PATIENT DISPOSITION:  PACU - hemodynamically stable.   Delay start of Pharmacological VTE agent (>24hrs) due to surgical blood loss or risk of bleeding: na  29881   

## 2016-07-29 ENCOUNTER — Encounter (HOSPITAL_COMMUNITY): Payer: Self-pay | Admitting: Orthopedic Surgery

## 2016-07-29 ENCOUNTER — Telehealth: Payer: Self-pay | Admitting: Orthopedic Surgery

## 2016-07-29 ENCOUNTER — Other Ambulatory Visit: Payer: Self-pay | Admitting: Orthopedic Surgery

## 2016-07-29 DIAGNOSIS — Z9889 Other specified postprocedural states: Secondary | ICD-10-CM

## 2016-07-29 MED ORDER — HYDROCODONE-ACETAMINOPHEN 5-325 MG PO TABS: 1.0000 | ORAL_TABLET | ORAL | 0 refills | Status: DC | PRN

## 2016-07-29 NOTE — Telephone Encounter (Signed)
Take what she has until it runs out then take what I have written back to the pharmacy

## 2016-07-29 NOTE — Telephone Encounter (Signed)
Ok come in to pick up prescription

## 2016-07-29 NOTE — Telephone Encounter (Signed)
Patient called to find out your reply about her pain medication. She states that she is totally out of medication. She states that the pharmacist told her that if you would wrote her a prescription for pain medication at a lessor dosage they would fill it for her, if not it will the 26th before she can get any medication.  Please advise

## 2016-08-01 ENCOUNTER — Telehealth: Payer: Self-pay | Admitting: Orthopedic Surgery

## 2016-08-01 NOTE — Telephone Encounter (Signed)
Patient called this morning that she was supposed to some leg exercises 25 times but she could not do them due to the knee swelling.  I asked her if her post op instructions included icing and elevating the knee and she said they did and she has been doing this. I asked her if she had been up walking around a lot and she said she had not.  I also asked her if the knee seemed warm to the touch or had any drainage or pus.  She said no to both of those  She said she was not having a lot of increased pain or anything, she just wanted us to know that she could not do the leg raises or bends that she instructed to do.  She said she would see Dr. Romeo AppleHarrison in the morning but she just wanted us to know this.

## 2016-08-01 NOTE — Telephone Encounter (Signed)
Routing to Dr Harrison 

## 2016-08-02 ENCOUNTER — Encounter: Payer: Self-pay | Admitting: Orthopedic Surgery

## 2016-08-02 ENCOUNTER — Ambulatory Visit (INDEPENDENT_AMBULATORY_CARE_PROVIDER_SITE_OTHER): Payer: Self-pay | Admitting: Orthopedic Surgery

## 2016-08-02 DIAGNOSIS — Z9889 Other specified postprocedural states: Secondary | ICD-10-CM

## 2016-08-02 DIAGNOSIS — S83001D Unspecified subluxation of right patella, subsequent encounter: Secondary | ICD-10-CM

## 2016-08-02 DIAGNOSIS — Q741 Congenital malformation of knee: Secondary | ICD-10-CM

## 2016-08-02 DIAGNOSIS — Q682 Congenital deformity of knee: Secondary | ICD-10-CM

## 2016-08-02 DIAGNOSIS — Z4889 Encounter for other specified surgical aftercare: Secondary | ICD-10-CM

## 2016-08-02 DIAGNOSIS — M1711 Unilateral primary osteoarthritis, right knee: Secondary | ICD-10-CM

## 2016-08-02 MED ORDER — OXYCODONE-ACETAMINOPHEN 5-325 MG PO TABS: 1.0000 | ORAL_TABLET | ORAL | 0 refills | Status: DC | PRN

## 2016-08-02 NOTE — Progress Notes (Signed)
Patient ID: Jodi Gray, female   DOB: 1970-10-30, 46 y.o.   MRN: [id number]  POSTOP VISIT   Chief Complaint  Patient presents with  . Follow-up    Gaylene BrooksSARK DEBRID, DOS 07/28/16   Encounter Diagnoses  Name Primary?  . Patella dysplasia Yes  . Arthritis of right knee   . Subluxation of right patella, subsequent encounter   . Aftercare following surgery   . S/P right knee arthroscopy      PRE-OPERATIVE DIAGNOSIS:  RIGHT KNEE ARTHRITIS, PATELLA DYSPLASIA   POST-OPERATIVE DIAGNOSIS:  torn medial meniscus, subluxation patella   PROCEDURE:  Procedure(s): ARTHROSCOPY KNEE partial medial meniscectomy (Right)    Operative findings Under anesthesia the patella was dislocated in extension and relocated at 30 flexion. Cruciate ligaments intact. Collateral ligaments intact.   Intraoperative findings normal medial compartment articular cartilage with small re-tear medial meniscus and small posterior horn remnant of old prior meniscectomy   Lateral compartment articular cartilage and meniscus normal.   Anterior cruciate ligament probe normal.   Trochlea centrally normal.   Lateral trochlea grade 4 chondral changes. The patella again sat in the lateral position in extension. The medial and lateral facets were arthritic grade 3 and 4.    She had severe pain on ibuprofen 800 and norco 7.5 and 2 (5mg ) norco  Has been on nrco 7.5 q 8 for 1 year for back pain   Had a lot of swelling that has now decreased   Portals are normal   Knee flexion is 40 full extension  Encounter Diagnoses  Name Primary?  . Patella dysplasia Yes  . Arthritis of right knee   . Subluxation of right patella, subsequent encounter   . Aftercare following surgery   . S/P right knee arthroscopy     Heel slides and ice wbat   Meds ordered this encounter  Medications  . oxyCODONE-acetaminophen (PERCOCET/ROXICET) 5-325 MG tablet    Sig: Take 1 tablet by mouth every 4 (four) hours as needed for severe pain.    Dispense:  84 tablet    Refill:  0     10:14 AM Fuller CanadaStanley Harrison, MD 08/02/2016

## 2016-08-16 ENCOUNTER — Telehealth: Payer: Self-pay | Admitting: Orthopedic Surgery

## 2016-08-16 ENCOUNTER — Other Ambulatory Visit: Payer: Self-pay | Admitting: Orthopedic Surgery

## 2016-08-16 DIAGNOSIS — Z9889 Other specified postprocedural states: Secondary | ICD-10-CM

## 2016-08-16 MED ORDER — HYDROCODONE-ACETAMINOPHEN 5-325 MG PO TABS: 1.0000 | ORAL_TABLET | ORAL | 0 refills | Status: DC | PRN

## 2016-08-16 NOTE — Telephone Encounter (Signed)
ROUTING TO DR HARRISON 

## 2016-08-16 NOTE — Telephone Encounter (Signed)
Patient requests refill on Hydrocodone/Actaminophen 5-325  Mgs.    Qty  30  Sig: Take 1 tablet by mouth every 4 (four) hours as needed for moderate pain.

## 2016-08-19 ENCOUNTER — Ambulatory Visit (INDEPENDENT_AMBULATORY_CARE_PROVIDER_SITE_OTHER): Payer: BLUE CROSS/BLUE SHIELD | Admitting: Orthopedic Surgery

## 2016-08-19 DIAGNOSIS — Q741 Congenital malformation of knee: Secondary | ICD-10-CM

## 2016-08-19 DIAGNOSIS — Z9889 Other specified postprocedural states: Secondary | ICD-10-CM

## 2016-08-19 DIAGNOSIS — Q682 Congenital deformity of knee: Secondary | ICD-10-CM

## 2016-08-19 NOTE — Progress Notes (Signed)
Routine postop visit  Status post arthroscopy right knee on 07/28/2016  3 weeks postop  Patient's pain has gotten better her swelling is gone down but she is still very apprehensive about her knee based on the subluxation occurs between flexion and extension  She cannot extend the knee today without any holding the patella in place  She has gotten her knee straighter knee flexion has improved to 105  She'll follow-up with me on the 27th no driving until she can perform a straight leg raise  Going to order a 20 inch lateral patella stabilizer brace and then at some point we need to get her seen at [facility] for second opinion as to what type of surgery she should have at this point. She did have patella replacement the opposite side she is [age] so she is somewhat getting to the age where a knee replacement might be an option but with .dx patellofemoral mechanics this will be quite the [place] to correct    Encounter Diagnoses  Name Primary?  . Patella dysplasia Yes  . S/P right knee arthroscopy

## 2016-09-05 ENCOUNTER — Ambulatory Visit (INDEPENDENT_AMBULATORY_CARE_PROVIDER_SITE_OTHER): Payer: BLUE CROSS/BLUE SHIELD | Admitting: Orthopedic Surgery

## 2016-09-05 ENCOUNTER — Encounter: Payer: Self-pay | Admitting: Orthopedic Surgery

## 2016-09-05 DIAGNOSIS — M1711 Unilateral primary osteoarthritis, right knee: Secondary | ICD-10-CM

## 2016-09-05 DIAGNOSIS — Z4889 Encounter for other specified surgical aftercare: Secondary | ICD-10-CM

## 2016-09-05 DIAGNOSIS — Q682 Congenital deformity of knee: Secondary | ICD-10-CM

## 2016-09-05 DIAGNOSIS — Q741 Congenital malformation of knee: Secondary | ICD-10-CM

## 2016-09-05 DIAGNOSIS — Z9889 Other specified postprocedural states: Secondary | ICD-10-CM

## 2016-09-05 NOTE — Progress Notes (Signed)
Postop visit  Status post arthroscopy right knee on 07/28/2016  postop day #39  The patient is having improvement in her terminal knee extension as she demonstrated in the office but she still having trouble doing a straight leg raise. She has flexion now to 125 measurement goniometer  She has soreness around the kneecap  He put her in a lateral J hinged brace and she still had subluxation and did not fit the off-the-shelf bracing  She will need a custom fit or different model  Encounter Diagnoses  Name Primary?  . Patella dysplasia   . S/P right knee arthroscopy   . Arthritis of right knee   . Aftercare following surgery Yes    Plan continue quad sets  Heel slides  TKE 's   Patient will have a custom fitting by brace company for patellofemoral stabilization brace  Arrange consult with [facility] for OA with PTF subluxation;  FU 4 weeks

## 2016-09-06 ENCOUNTER — Encounter (HOSPITAL_COMMUNITY): Payer: Self-pay | Admitting: Physical Therapy

## 2016-09-06 NOTE — Therapy (Signed)
[place], [place], [address] Phone: [phone]   Fax:  [phone]  Patient Details  Name: HELLEN SHANLEY MRN: [id number] Date of Birth: 1970-05-05 Referring Provider:  No ref. provider found  Encounter Date: 09/06/2016   PHYSICAL THERAPY DISCHARGE SUMMARY  Visits from Start of Care: 16  Current functional level related to goals / functional outcomes: Patient self-discharged due to high level of function; has not returned since last skilled session    Remaining deficits: See note on 07/24/15   Education / Equipment: N/A  Plan: Patient agrees to discharge.  Patient goals were partially met. Patient is being discharged due to being pleased with the current functional level.  ?????       Deniece Ree PT, DPT [place] [address] [place], [place], [address] Phone: [phone]   Fax:  [phone]

## 2016-09-08 ENCOUNTER — Telehealth: Payer: Self-pay | Admitting: Orthopedic Surgery

## 2016-09-08 ENCOUNTER — Encounter: Payer: Self-pay | Admitting: Orthopedic Surgery

## 2016-09-08 NOTE — Telephone Encounter (Signed)
Patient was present in our office yesterday, 09/07/16, for brace fitting with [employer] representative, Kipp BroodMatt Craven prescribed by Dr Romeo AppleHarrison.  She called today to request return to work date updated to return 09/13/16, Vs 09/08/16, note has not yet been sent to employer. Okay to issue?

## 2016-09-09 NOTE — Telephone Encounter (Signed)
Called patient, notified note is ready for pick up

## 2016-09-09 NOTE — Telephone Encounter (Signed)
yes

## 2016-10-03 ENCOUNTER — Encounter: Payer: Self-pay | Admitting: Orthopedic Surgery

## 2016-10-03 ENCOUNTER — Ambulatory Visit (INDEPENDENT_AMBULATORY_CARE_PROVIDER_SITE_OTHER): Payer: BLUE CROSS/BLUE SHIELD | Admitting: Orthopedic Surgery

## 2016-10-03 VITALS — BP 134/93 | HR 70 | Ht 66.0 in | Wt 174.0 lb

## 2016-10-03 DIAGNOSIS — G8929 Other chronic pain: Secondary | ICD-10-CM

## 2016-10-03 DIAGNOSIS — Z4889 Encounter for other specified surgical aftercare: Secondary | ICD-10-CM

## 2016-10-03 DIAGNOSIS — M545 Low back pain: Secondary | ICD-10-CM

## 2016-10-03 DIAGNOSIS — Z9889 Other specified postprocedural states: Secondary | ICD-10-CM

## 2016-10-03 MED ORDER — HYDROCODONE-ACETAMINOPHEN 5-325 MG PO TABS: 1.0000 | ORAL_TABLET | Freq: Four times a day (QID) | ORAL | 0 refills | Status: DC | PRN

## 2016-10-03 MED ORDER — IBUPROFEN 800 MG PO TABS: 800.0000 mg | ORAL_TABLET | Freq: Three times a day (TID) | ORAL | 5 refills | Status: AC | PRN

## 2016-10-03 NOTE — Progress Notes (Signed)
Postop visit   Chief Complaint  Patient presents with  . 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 We will make a referral to [facility] for evaluation for possible patellofemoral replacement versus reconstructive surgery of the extensor mechanism  She is currently on ibuprofen and hydrocodone for pain relief but still has subluxating patella. She will get a patellofemoral stabilizer brace this week Encounter Diagnoses  Name Primary?  . H/O knee surgery   . Chronic midline low back pain without sciatica   . Aftercare following surgery Yes    I have her coming back in 6 weeks she will change the appointment if she hasn't been seen at [facility] by that time  Her primary care doctor says that she violated her pain management contract which is not true. She took a drug screen test and had hydrocodone opioid medication in her system from the medicine we gave her for surgery which was appropriate.

## 2016-10-04 ENCOUNTER — Other Ambulatory Visit: Payer: Self-pay | Admitting: *Deleted

## 2016-10-04 DIAGNOSIS — S83001D Unspecified subluxation of right patella, subsequent encounter: Secondary | ICD-10-CM

## 2016-10-10 ENCOUNTER — Telehealth: Payer: Self-pay | Admitting: Orthopedic Surgery

## 2016-10-10 NOTE — Telephone Encounter (Signed)
Patient called to follow up on referral to [facility] (may I call the referral department to check on?)  Patient relays she is having increased pain and episodes of "locking up" - asking about whether we can expedite the referral? Her [phone]

## 2016-10-10 NOTE — Telephone Encounter (Signed)
Awaiting physician review from [facility].

## 2016-10-13 NOTE — Telephone Encounter (Signed)
Patient has been scheduled with DR. Ferguson 10/25/16 at 8:30 am at [facility]

## 2016-10-17 ENCOUNTER — Telehealth: Payer: Self-pay | Admitting: Orthopedic Surgery

## 2016-10-17 ENCOUNTER — Other Ambulatory Visit: Payer: Self-pay | Admitting: Orthopedic Surgery

## 2016-10-17 DIAGNOSIS — Z9889 Other specified postprocedural states: Secondary | ICD-10-CM

## 2016-10-17 MED ORDER — HYDROCODONE-ACETAMINOPHEN 5-325 MG PO TABS: 1.0000 | ORAL_TABLET | Freq: Four times a day (QID) | ORAL | 0 refills | Status: DC | PRN

## 2016-10-17 NOTE — Telephone Encounter (Signed)
Highly recommend against that

## 2016-10-17 NOTE — Telephone Encounter (Signed)
Patient called and requested to go back to Hydrocodone/Acetaminophen 7.5-325  Mgs.  Sig: Take 1 tablet by mouth every 6 (six) hours as needed for moderate pain.          Patient states that the Hydrocodone/Acetaminophen 5-325 mgs. Was not helping as much

## 2016-10-17 NOTE — Telephone Encounter (Signed)
Patient requests refill on Hydrocodone/Acetaminophen 5-325 mgs.   Qty  60 ° °Sig: Take 1 tablet by mouth every 6 (six) hours as needed for moderate pain. °

## 2016-10-31 ENCOUNTER — Other Ambulatory Visit: Payer: Self-pay | Admitting: Orthopedic Surgery

## 2016-10-31 ENCOUNTER — Telehealth: Payer: Self-pay | Admitting: Orthopedic Surgery

## 2016-10-31 DIAGNOSIS — Z9889 Other specified postprocedural states: Secondary | ICD-10-CM

## 2016-10-31 MED ORDER — HYDROCODONE-ACETAMINOPHEN 5-325 MG PO TABS: 1.0000 | ORAL_TABLET | Freq: Four times a day (QID) | ORAL | 0 refills | Status: DC | PRN

## 2016-10-31 NOTE — Telephone Encounter (Signed)
Hydrocodone-Acetaminophen  5/325mg  Qty 60 tablets  Take 1 tablet by mouth every 6 (six) hours as needed for moderate pain.

## 2016-10-31 NOTE — Telephone Encounter (Signed)
ready

## 2016-11-01 ENCOUNTER — Telehealth: Payer: Self-pay | Admitting: Orthopedic Surgery

## 2016-11-01 NOTE — Telephone Encounter (Signed)
done

## 2016-11-01 NOTE — Telephone Encounter (Signed)
Patient relays that she did see the [facility]paedic specialist [facility] as scheduled, however, states that she has received a call back from the provider there, and that her next appointment with orthopaedic surgeon to discuss possible knee replacement is 11/15/16.  Asking if she is to still keep her scheduled appointment w/Dr Romeo AppleHarrison just prior to this visit?

## 2016-11-14 ENCOUNTER — Ambulatory Visit: Payer: BLUE CROSS/BLUE SHIELD | Admitting: Orthopedic Surgery

## 2016-11-14 VITALS — BP 120/82 | HR 73 | Ht 66.0 in | Wt 174.0 lb

## 2016-11-14 DIAGNOSIS — M545 Low back pain, unspecified: Secondary | ICD-10-CM

## 2016-11-14 DIAGNOSIS — Z9889 Other specified postprocedural states: Secondary | ICD-10-CM

## 2016-11-14 DIAGNOSIS — G8929 Other chronic pain: Secondary | ICD-10-CM

## 2016-11-14 MED ORDER — HYDROCODONE-ACETAMINOPHEN 5-325 MG PO TABS: 1.0000 | ORAL_TABLET | ORAL | 0 refills | Status: DC | PRN

## 2016-11-14 MED ORDER — METHOCARBAMOL 500 MG PO TABS: 500.0000 mg | ORAL_TABLET | Freq: Four times a day (QID) | ORAL | 0 refills | Status: AC | PRN

## 2016-11-14 MED ORDER — NORTRIPTYLINE HCL 10 MG PO CAPS: 10.0000 mg | ORAL_CAPSULE | Freq: Every day | ORAL | 0 refills | Status: AC

## 2016-11-14 NOTE — Progress Notes (Signed)
Chief Complaint  Patient presents with  . Follow-up    Recheck on knees.    46 year old female will have certain surgical evaluation at [facility] this [facility].  I am refilling her hydrocodone.  She did want an increase in dose but we convinced her to try some other medications to assist with her back and knee pain.  She says that her knee and back hurt to the point where she cannot stand for long periods of time and she has to sit down.  She has been on hydrocodone for several years now and says it does not work as well sometimes she will have to take 3 at a time to get relief she is also on ibuprofen  System review: She has no radicular symptoms or bowel or bladder dysfunction.   Encounter Diagnoses  Name Primary?  . H/O knee surgery Yes  . Chronic midline low back pain without sciatica     Meds ordered this encounter  Medications  . HYDROcodone-acetaminophen (NORCO/VICODIN) 5-325 MG tablet    Sig: Take 1 tablet every 4 (four) hours as needed by mouth for moderate pain.    Dispense:  60 tablet    Refill:  0  . nortriptyline (PAMELOR) 10 MG capsule    Sig: Take 1 capsule (10 mg total) at bedtime by mouth.    Dispense:  30 capsule    Refill:  0  . methocarbamol (ROBAXIN) 500 MG tablet    Sig: Take 1 tablet (500 mg total) every 6 (six) hours as needed by mouth for muscle spasms.    Dispense:  60 tablet    Refill:  0

## 2016-11-28 ENCOUNTER — Telehealth: Payer: Self-pay | Admitting: Orthopedic Surgery

## 2016-11-28 ENCOUNTER — Other Ambulatory Visit: Payer: Self-pay | Admitting: Orthopedic Surgery

## 2016-11-28 DIAGNOSIS — Z9889 Other specified postprocedural states: Secondary | ICD-10-CM

## 2016-11-28 MED ORDER — HYDROCODONE-ACETAMINOPHEN 5-325 MG PO TABS: 1.0000 | ORAL_TABLET | ORAL | 0 refills | Status: DC | PRN

## 2016-11-28 NOTE — Telephone Encounter (Signed)
Refill request for Hydrocodone/ Acetaminophen 5-325  Mgs.  Qty  60  Sig: Take 1 tablet every 4 (four) hours as needed by mouth for moderate pain.

## 2016-12-13 ENCOUNTER — Telehealth: Payer: Self-pay | Admitting: Orthopedic Surgery

## 2016-12-13 ENCOUNTER — Other Ambulatory Visit: Payer: Self-pay | Admitting: Orthopedic Surgery

## 2016-12-13 DIAGNOSIS — G8929 Other chronic pain: Secondary | ICD-10-CM

## 2016-12-13 DIAGNOSIS — M545 Low back pain: Principal | ICD-10-CM

## 2016-12-13 MED ORDER — HYDROCODONE-ACETAMINOPHEN 5-325 MG PO TABS: 1.0000 | ORAL_TABLET | Freq: Four times a day (QID) | ORAL | 0 refills | Status: DC | PRN

## 2016-12-13 NOTE — Progress Notes (Signed)
420  Sedative  160  Stimulant  000  Explanation and Guidance  OVERDOSE RISK SCORE  130

## 2016-12-13 NOTE — Telephone Encounter (Signed)
Patient has been told to call her pharmacy.

## 2016-12-13 NOTE — Telephone Encounter (Signed)
Pick up at pharmacy

## 2016-12-13 NOTE — Telephone Encounter (Signed)
Hydrocodone-Acetaminophen  5/325 mg  Qty 60 Tablets  Take 1 tablet every 4 (four) hours as needed by mouth for moderate pain.

## 2016-12-20 ENCOUNTER — Telehealth: Payer: Self-pay | Admitting: Orthopedic Surgery

## 2016-12-20 NOTE — Telephone Encounter (Signed)
Patient requests refill on Hydrocodone/Acetaminophen 5-325 mgs.   Qty  30 ° °Sig: Take 1 tablet by mouth every 6 (six) hours as needed for moderate pain.  °      ° ° °

## 2016-12-21 ENCOUNTER — Other Ambulatory Visit: Payer: Self-pay | Admitting: Orthopedic Surgery

## 2016-12-21 DIAGNOSIS — M25561 Pain in right knee: Principal | ICD-10-CM

## 2016-12-21 DIAGNOSIS — G8929 Other chronic pain: Secondary | ICD-10-CM

## 2016-12-21 DIAGNOSIS — M25562 Pain in left knee: Principal | ICD-10-CM

## 2016-12-21 MED ORDER — HYDROCODONE-ACETAMINOPHEN 5-325 MG PO TABS: 1.0000 | ORAL_TABLET | Freq: Four times a day (QID) | ORAL | 0 refills | Status: DC | PRN

## 2016-12-21 NOTE — Telephone Encounter (Signed)
[place]

## 2016-12-27 ENCOUNTER — Other Ambulatory Visit: Payer: Self-pay | Admitting: Orthopedic Surgery

## 2016-12-27 ENCOUNTER — Telehealth: Payer: Self-pay | Admitting: Orthopedic Surgery

## 2016-12-27 DIAGNOSIS — M25561 Pain in right knee: Principal | ICD-10-CM

## 2016-12-27 DIAGNOSIS — M25562 Pain in left knee: Principal | ICD-10-CM

## 2016-12-27 DIAGNOSIS — G8929 Other chronic pain: Secondary | ICD-10-CM

## 2016-12-27 MED ORDER — HYDROCODONE-ACETAMINOPHEN 5-325 MG PO TABS: 1.0000 | ORAL_TABLET | Freq: Four times a day (QID) | ORAL | 0 refills | Status: DC | PRN

## 2016-12-27 NOTE — Progress Notes (Signed)
PMP AWARE CHECKED  

## 2016-12-27 NOTE — Telephone Encounter (Signed)
Called patient; aware. 

## 2016-12-27 NOTE — Telephone Encounter (Signed)
Patient called for refill:  HYDROcodone-acetaminophen (NORCO) 5-325 MG tablet 30 tablet  [facility] [place]  (patient asked about refill request over Christmas week?)

## 2016-12-27 NOTE — Telephone Encounter (Signed)
ESCRIBE

## 2017-01-11 ENCOUNTER — Other Ambulatory Visit: Payer: Self-pay | Admitting: Orthopedic Surgery

## 2017-01-11 ENCOUNTER — Telehealth: Payer: Self-pay | Admitting: Orthopedic Surgery

## 2017-01-11 DIAGNOSIS — M25562 Pain in left knee: Principal | ICD-10-CM

## 2017-01-11 DIAGNOSIS — G8929 Other chronic pain: Secondary | ICD-10-CM

## 2017-01-11 DIAGNOSIS — M25561 Pain in right knee: Principal | ICD-10-CM

## 2017-01-11 MED ORDER — HYDROCODONE-ACETAMINOPHEN 5-325 MG PO TABS: 1.0000 | ORAL_TABLET | Freq: Four times a day (QID) | ORAL | 0 refills | Status: DC | PRN

## 2017-01-11 NOTE — Telephone Encounter (Signed)
Done electronically.

## 2017-01-11 NOTE — Progress Notes (Signed)
PMP

## 2017-01-11 NOTE — Telephone Encounter (Signed)
Patient requests refill on Hydrocodone/Acetaminophen 5-325  Mgs.   Qty  56  Sig: Take 1 tablet by mouth every 6 (six) hours as needed for moderate pain.  I confirmed with the patient [facility] [place]

## 2017-01-23 ENCOUNTER — Telehealth: Payer: Self-pay | Admitting: Orthopedic Surgery

## 2017-01-23 DIAGNOSIS — M25562 Pain in left knee: Principal | ICD-10-CM

## 2017-01-23 DIAGNOSIS — G8929 Other chronic pain: Secondary | ICD-10-CM

## 2017-01-23 DIAGNOSIS — M25561 Pain in right knee: Principal | ICD-10-CM

## 2017-01-23 NOTE — Telephone Encounter (Signed)
Patient called for refill:  HYDROcodone-acetaminophen (NORCO) 5-325 MG tablet 56 tablet  - [facility] [place]

## 2017-01-24 ENCOUNTER — Other Ambulatory Visit: Payer: Self-pay | Admitting: Orthopedic Surgery

## 2017-01-24 DIAGNOSIS — M25562 Pain in left knee: Principal | ICD-10-CM

## 2017-01-24 DIAGNOSIS — M25561 Pain in right knee: Principal | ICD-10-CM

## 2017-01-24 DIAGNOSIS — G8929 Other chronic pain: Secondary | ICD-10-CM

## 2017-01-24 MED ORDER — HYDROCODONE-ACETAMINOPHEN 5-325 MG PO TABS: 1.0000 | ORAL_TABLET | Freq: Four times a day (QID) | ORAL | 0 refills | Status: AC | PRN

## 2017-01-24 NOTE — Telephone Encounter (Signed)
I have sent her a my chart message

## 2017-01-24 NOTE — Telephone Encounter (Signed)
Left message for her to call me back. 

## 2017-01-24 NOTE — Telephone Encounter (Signed)
Can you call her and tell her we need to send her to pain management

## 2017-01-24 NOTE — Addendum Note (Signed)
Addended byCaffie Damme: Zaion Hreha W on: 01/24/2017 02:42 PM   Modules accepted: Orders

## 2017-02-02 NOTE — Telephone Encounter (Signed)
I spoke to patient and she has gotten my messages, agrees to pain management referral  Will expect call in next 4-6 weeks

## 2017-09-22 IMAGING — DX DG KNEE COMPLETE 4+V*L*
4 series · 4 of 4 positions shown · non-contrast
Comparison: None.

CLINICAL DATA: Fall with left knee pop.  Initial encounter.

EXAM:
LEFT KNEE - COMPLETE 4+ VIEW

[knee ap (1 of 3)]
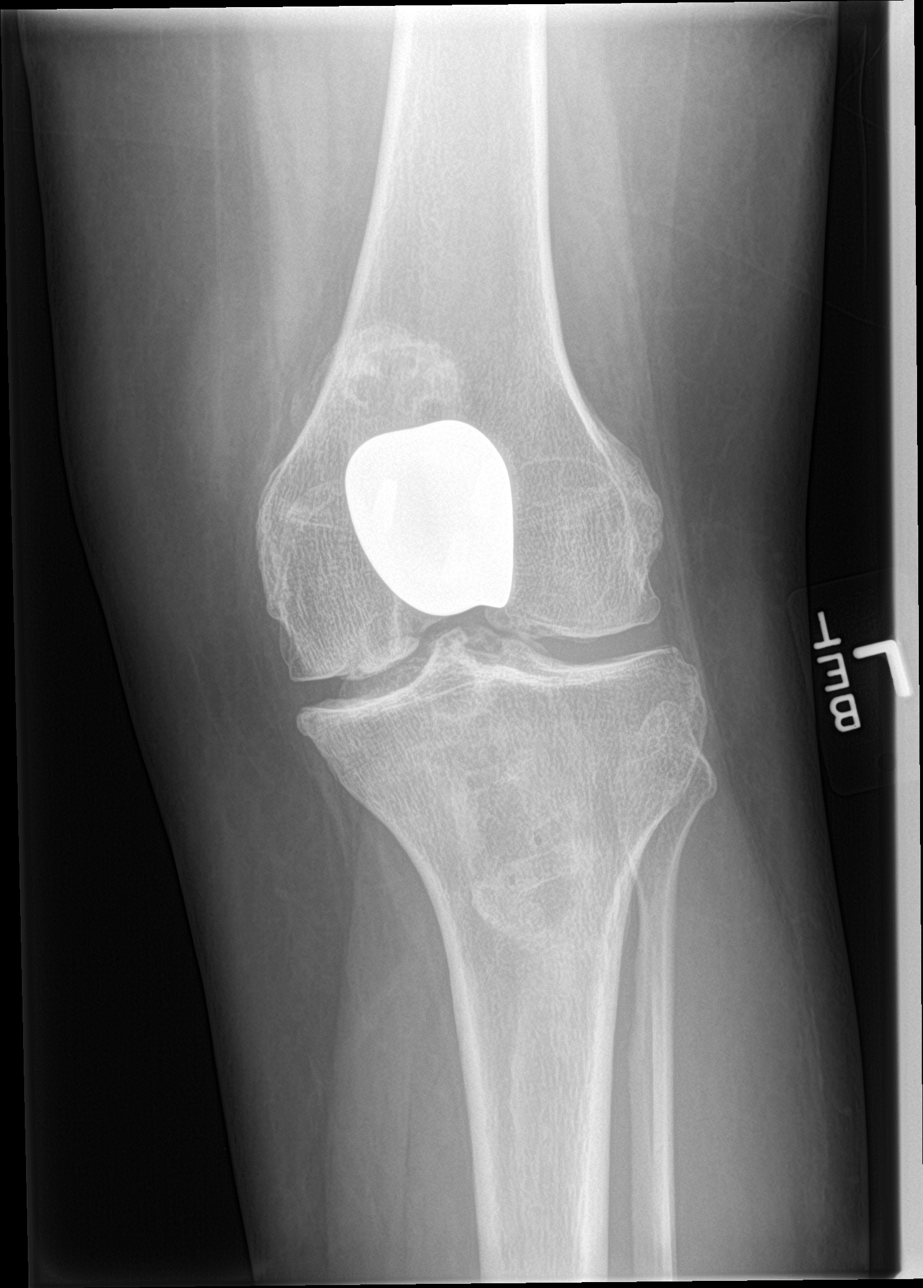

[knee lat]
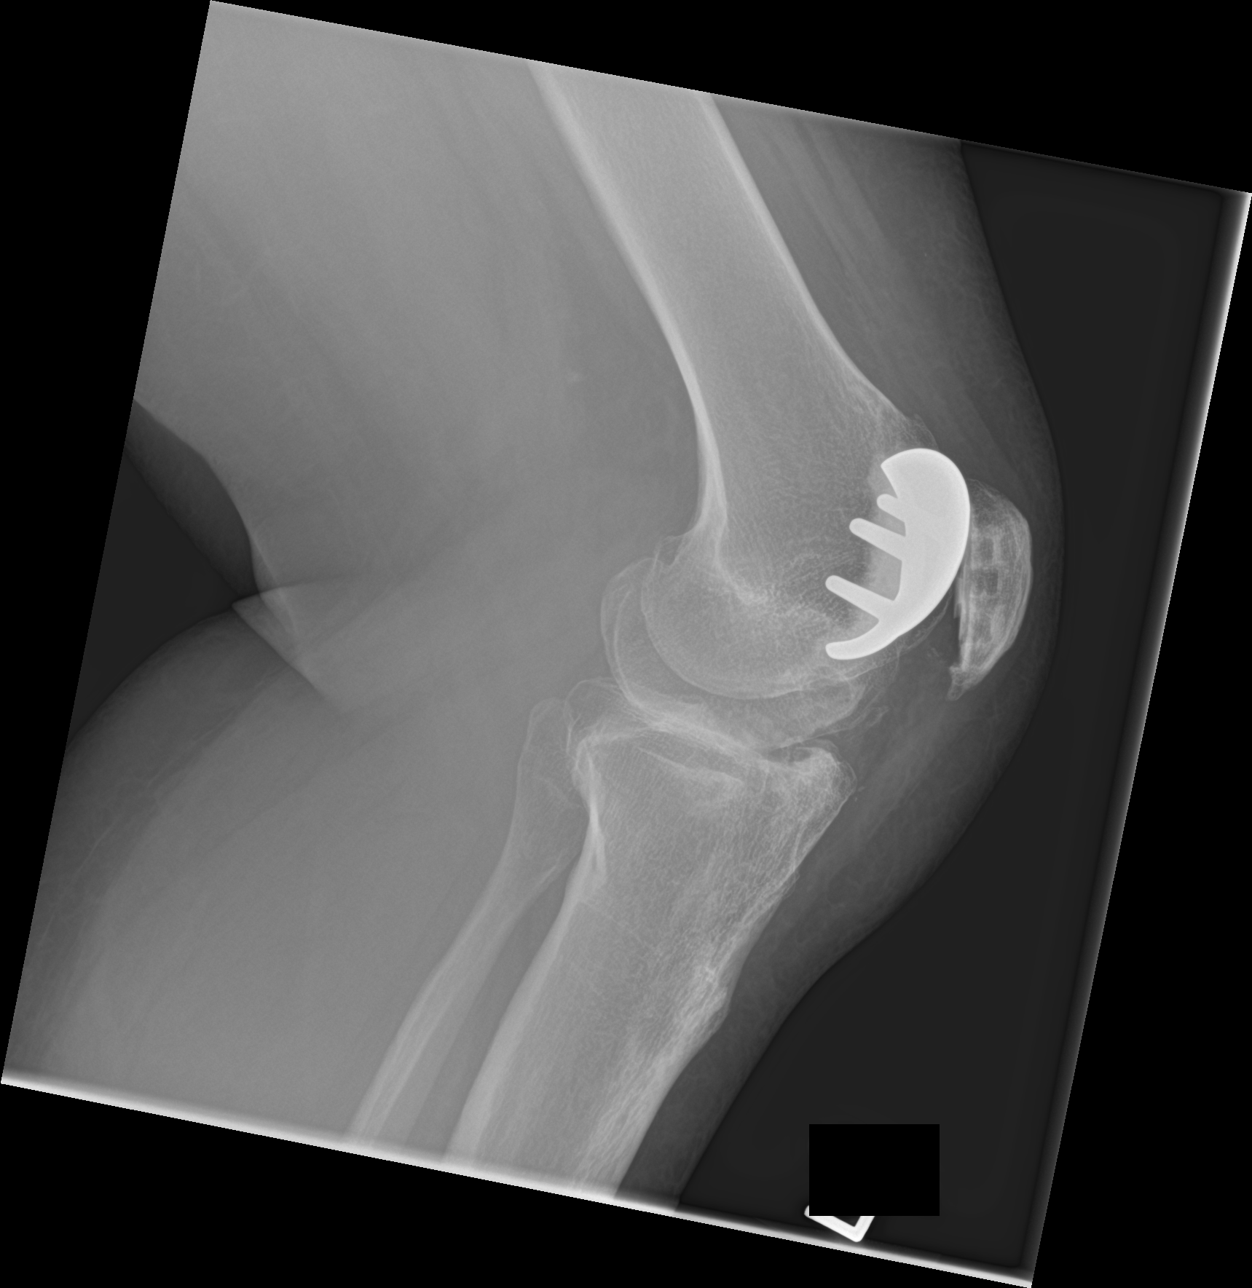

[knee ap (2 of 3)]
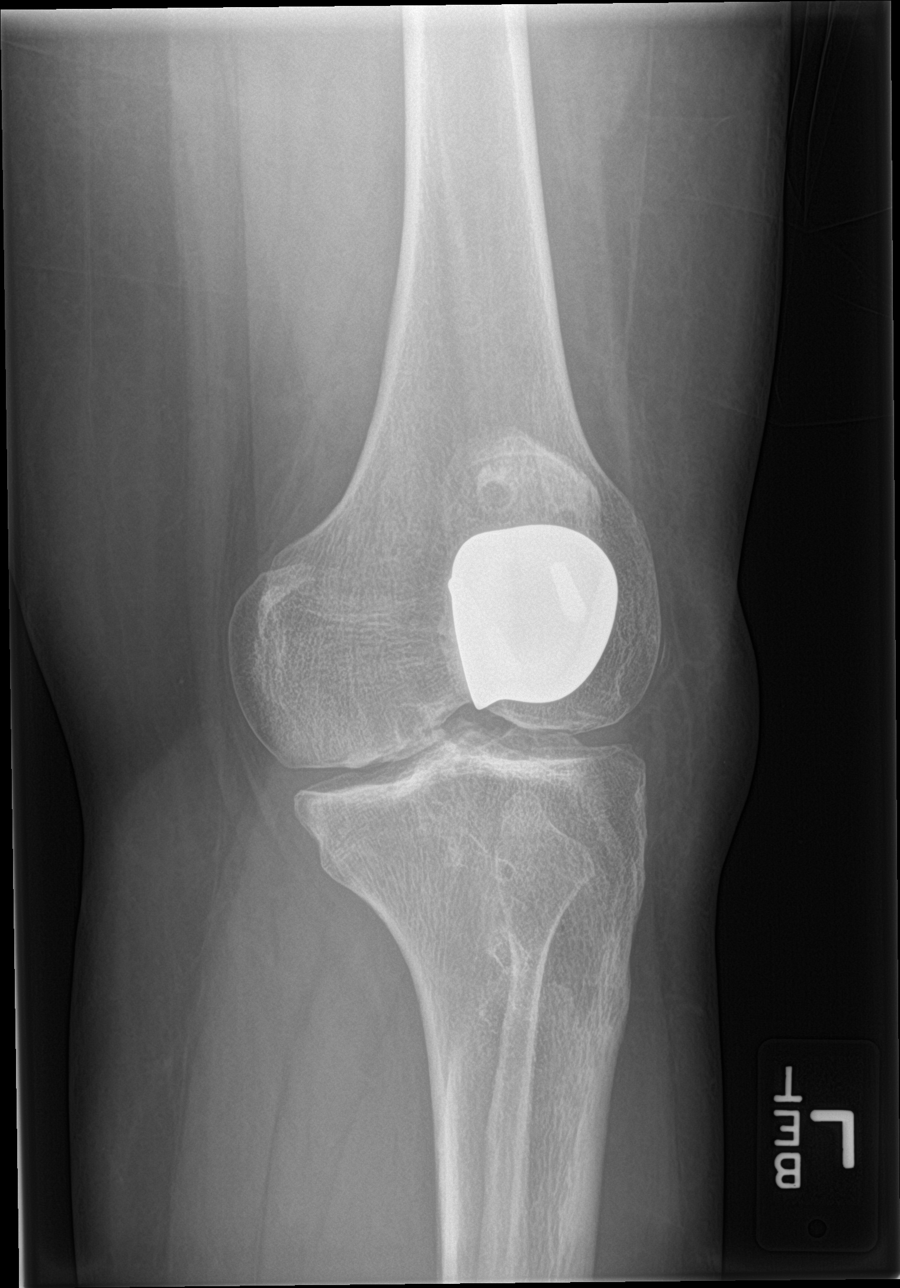

[knee ap (3 of 3)]
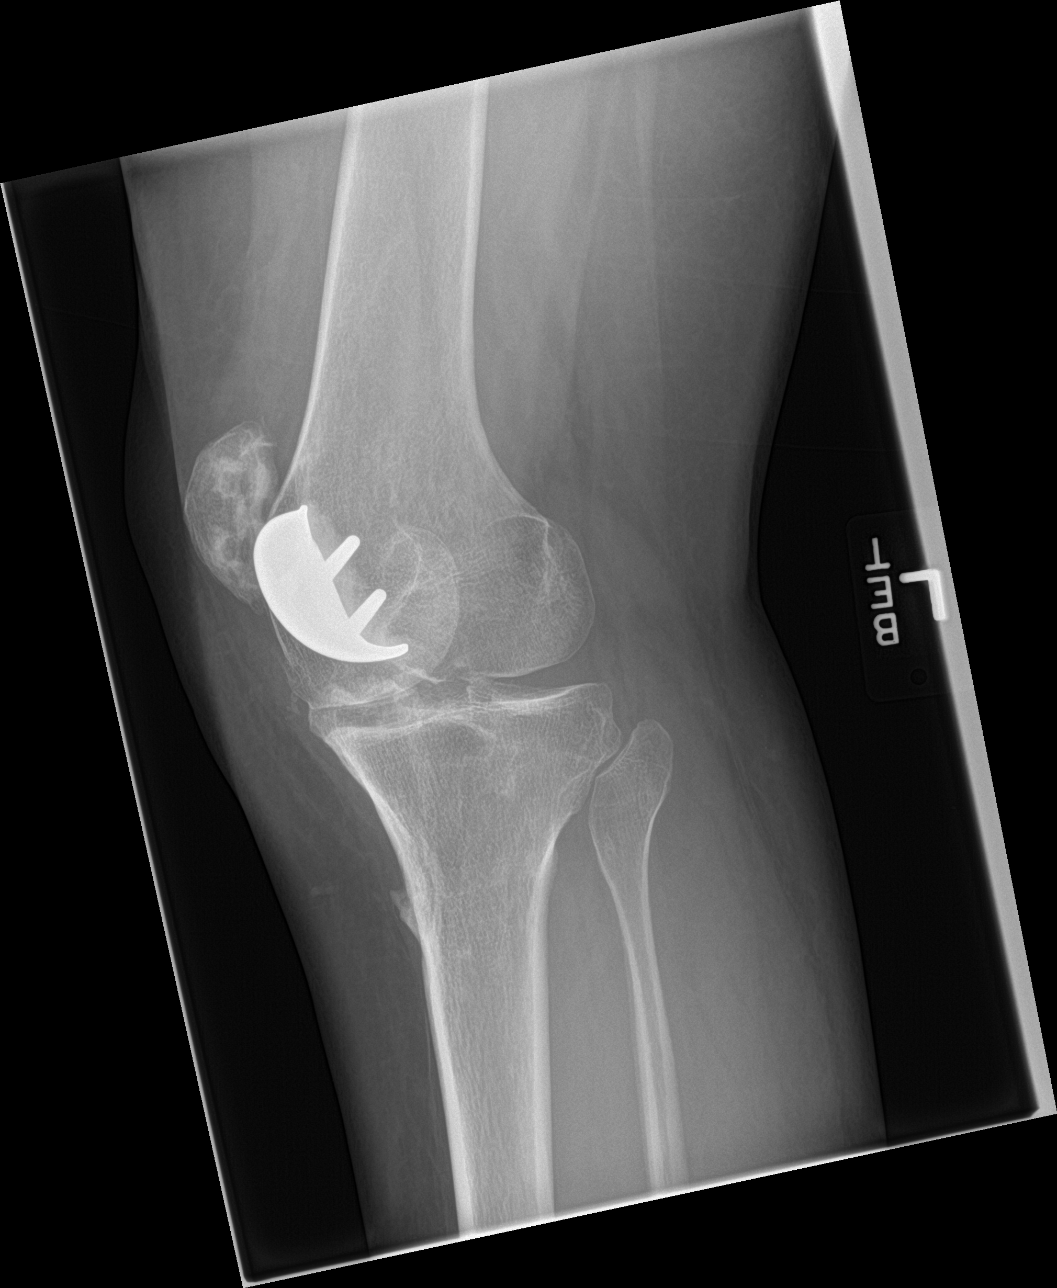

[4 of 4 positions shown; findings below may reference images not displayed]

FINDINGS: Patellofemoral compartment arthroplasty. Patella appears high in the
frontal and oblique projections but is normally positioned when seen
laterally. No evidence of acute fracture. No signs of hardware
loosening.

Medial and lateral osteoarthritis.
IMPRESSION: 1. No acute osseous finding.
2. Osteoarthritis with patellofemoral arthroplasty.

## 2019-09-30 ENCOUNTER — Ambulatory Visit
Admission: EM | Admit: 2019-09-30 | Discharge: 2019-09-30 | Disposition: A | Discharge status: Home / Self Care | Payer: BC Managed Care – PPO | Attending: Emergency Medicine | Admitting: Emergency Medicine

## 2019-09-30 ENCOUNTER — Other Ambulatory Visit: Payer: Self-pay

## 2019-09-30 DIAGNOSIS — N390 Urinary tract infection, site not specified: Secondary | ICD-10-CM | POA: Insufficient documentation

## 2019-09-30 DIAGNOSIS — R300 Dysuria: Secondary | ICD-10-CM | POA: Insufficient documentation

## 2019-09-30 LAB — POCT URINALYSIS DIP (MANUAL ENTRY)
Bilirubin, UA: NEGATIVE
Glucose, UA: NEGATIVE mg/dL
Ketones, POC UA: NEGATIVE mg/dL
Nitrite, UA: NEGATIVE
Protein Ur, POC: NEGATIVE mg/dL
Spec Grav, UA: 1.02 (ref 1.010–1.025)
Urobilinogen, UA: 0.2 E.U./dL
pH, UA: 6 (ref 5.0–8.0)

## 2019-09-30 MED ORDER — NITROFURANTOIN MONOHYD MACRO 100 MG PO CAPS: 100.0000 mg | ORAL_CAPSULE | Freq: Two times a day (BID) | ORAL | 0 refills | Status: AC

## 2019-09-30 MED ORDER — PHENAZOPYRIDINE HCL 100 MG PO TABS: 100.0000 mg | ORAL_TABLET | Freq: Three times a day (TID) | ORAL | 0 refills | Status: AC | PRN

## 2019-09-30 NOTE — Discharge Instructions (Addendum)
Urine culture sent.  We will call you with the results.   Push fluids and get plenty of rest.   Take antibiotic as directed and to completion Take pyridium as prescribed and as needed for symptomatic relief Follow up with PCP if symptoms persists Return here or go to ER if you have any new or worsening symptoms such as fever, worsening abdominal pain, nausea/vomiting, flank pain, etc... 

## 2019-09-30 NOTE — ED Provider Notes (Signed)
[facility]   CC: Burning with urination  SUBJECTIVE:  Jodi Gray is a 49 y.o. female who presents to the urgent care for complaint of dysuria yesterday.  Patient denies a precipitating event, recent sexual encounter, excessive caffeine intake.  Denies abdomen/flank pain.  Has tried OTC medications without relief.  Symptoms are made worse with urination.  Admits to similar symptoms in the past.  Denies fever, chills, nausea, vomiting, abdominal pain, abnormal vaginal discharge or bleeding, hematuria.  LMP: No LMP recorded (lmp unknown). Patient is postmenopausal.  ROS: As in HPI.  All other pertinent ROS negative.     Past Medical History:  Diagnosis Date  . Arthritis   . Hypertension    Past Surgical History:  Procedure Laterality Date  . KNEE ARTHROSCOPY Right 07/28/2016   Procedure: ARTHROSCOPY KNEE partial medial menisectomy;  Surgeon: Vickki Hearing, MD;  Location: [facility];  Service: Orthopedics;  Laterality: Right;  . KNEE ARTHROSCOPY WITH MEDIAL MENISECTOMY Left 06/11/2015   Procedure: KNEE ARTHROSCOPY WITH MEDIAL MENISECTOMY;  Surgeon: Vickki Hearing, MD;  Location: [facility];  Service: Orthopedics;  Laterality: Left;  . KNEE SURGERY     right and left.  . TUBAL LIGATION     Allergies  Allergen Reactions  . Lisinopril Hives and Swelling    Swelling to throat, lips.   No current facility-administered medications on file prior to encounter.   Current Outpatient Medications on File Prior to Encounter  Medication Sig Dispense Refill  . bisoprolol-hydrochlorothiazide (ZIAC) 2.5-6.25 MG tablet Take 1 tablet by mouth daily.  1  . cetirizine (ZYRTEC) 10 MG tablet Take 10 mg by mouth daily as needed for allergies.    Marland Kitchen gabapentin (NEURONTIN) 100 MG capsule Take 100 mg by mouth 2 (two) times daily.    Marland Kitchen HYDROcodone-acetaminophen (NORCO) 5-325 MG tablet Take 1 tablet by mouth every 6 (six) hours as needed for moderate pain. 56 tablet 0  . ibuprofen  (ADVIL,MOTRIN) 800 MG tablet Take 1 tablet (800 mg total) by mouth every 8 (eight) hours as needed. 90 tablet 5  . methocarbamol (ROBAXIN) 500 MG tablet Take 1 tablet (500 mg total) every 6 (six) hours as needed by mouth for muscle spasms. 60 tablet 0  . nortriptyline (PAMELOR) 10 MG capsule Take 1 capsule (10 mg total) at bedtime by mouth. 30 capsule 0  . topiramate (TOPAMAX) 50 MG tablet TAKE ONE TABLET (50 MG) BY ORAL ROUTE TWO TIMES PER DAY  0   Social History   Socioeconomic History  . Marital status: Married    Spouse name: Not on file  . Number of children: Not on file  . Years of education: Not on file  . Highest education level: Not on file  Occupational History  . Not on file  Tobacco Use  . Smoking status: Passive Smoke Exposure - Never Smoker  . Smokeless tobacco: Never Used  Vaping Use  . Vaping Use: Never used  Substance and Sexual Activity  . Alcohol use: Yes    Comment: occasionally  . Drug use: No  . Sexual activity: Yes    Birth control/protection: Surgical  Other Topics Concern  . Not on file  Social History Narrative  . Not on file   Social Determinants of Health   Financial Resource Strain:   . Difficulty of Paying Living Expenses: Not on file  Food Insecurity:   . Worried About Programme researcher, broadcasting/film/video in the Last Year: Not on file  . Ran Out  of Food in the Last Year: Not on file  Transportation Needs:   . Lack of Transportation (Medical): Not on file  . Lack of Transportation (Non-Medical): Not on file  Physical Activity:   . Days of Exercise per Week: Not on file  . Minutes of Exercise per Session: Not on file  Stress:   . Feeling of Stress : Not on file  Social Connections:   . Frequency of Communication with Friends and Family: Not on file  . Frequency of Social Gatherings with Friends and Family: Not on file  . Attends Religious Services: Not on file  . Active Member of Clubs or Organizations: Not on file  . Attends Banker  Meetings: Not on file  . Marital Status: Not on file  Intimate Partner Violence:   . Fear of Current or Ex-Partner: Not on file  . Emotionally Abused: Not on file  . Physically Abused: Not on file  . Sexually Abused: Not on file   History reviewed. No pertinent family history.  OBJECTIVE:  Vitals:   09/30/19 1945  BP: 119/84  Pulse: 72  Resp: 18  Temp: 97.7 F (36.5 C)  SpO2: 98%   General appearance: AOx3 in no acute distress HEENT: NCAT.  Oropharynx clear.  Lungs: clear to auscultation bilaterally without adventitious breath sounds Heart: regular rate and rhythm.  Radial pulses 2+ symmetrical bilaterally Abdomen: soft; non-distended; no tenderness; bowel sounds present; no guarding or rebound tenderness Back: no CVA tenderness Extremities: no edema; symmetrical with no gross deformities Skin: warm and dry Neurologic: Ambulates from chair to exam table without difficulty Psychological: alert and cooperative; normal mood and affect  Labs Reviewed  POCT URINALYSIS DIP (MANUAL ENTRY) - Abnormal; Notable for the following components:      Result Value   Blood, UA moderate (*)    Leukocytes, UA Small (1+) (*)    All other components within normal limits  URINE CULTURE    ASSESSMENT & PLAN:  1. Dysuria   2. Acute lower UTI     Meds ordered this encounter  Medications  . nitrofurantoin, macrocrystal-monohydrate, (MACROBID) 100 MG capsule    Sig: Take 1 capsule (100 mg total) by mouth 2 (two) times daily.    Dispense:  10 capsule    Refill:  0  . phenazopyridine (PYRIDIUM) 100 MG tablet    Sig: Take 1 tablet (100 mg total) by mouth 3 (three) times daily as needed for pain.    Dispense:  10 tablet    Refill:  0    Urine culture sent.  We will call you with the results.   Push fluids and get plenty of rest.   Take antibiotic as directed and to completion Take pyridium as prescribed and as needed for symptomatic relief Follow up with PCP if symptoms  persists Return here or go to ER if you have any new or worsening symptoms such as fever, worsening abdominal pain, nausea/vomiting, flank pain, etc...  Outlined signs and symptoms indicating need for more acute intervention. Patient verbalized understanding. After Visit Summary given.     Durward Parcel, FNP 09/30/19 2018

## 2019-09-30 NOTE — ED Triage Notes (Signed)
Pt presents with c/o dysuria that began yesterday  

## 2019-10-01 ENCOUNTER — Other Ambulatory Visit: Payer: Self-pay | Admitting: Orthopedic Surgery

## 2019-10-01 DIAGNOSIS — M79671 Pain in right foot: Secondary | ICD-10-CM

## 2019-10-03 LAB — URINE CULTURE: Culture: >=100000 — AB

## 2019-10-08 ENCOUNTER — Other Ambulatory Visit: Payer: Self-pay | Admitting: Orthopedic Surgery

## 2019-10-08 DIAGNOSIS — M79604 Pain in right leg: Secondary | ICD-10-CM

## 2019-10-08 DIAGNOSIS — R200 Anesthesia of skin: Secondary | ICD-10-CM

## 2019-10-10 ENCOUNTER — Ambulatory Visit
Admission: RE | Admit: 2019-10-10 | Discharge: 2019-10-10 | Disposition: A | Discharge status: Home / Self Care | Payer: Worker's Compensation | Source: Ambulatory Visit | Attending: Orthopedic Surgery | Admitting: Orthopedic Surgery

## 2019-10-10 DIAGNOSIS — R200 Anesthesia of skin: Secondary | ICD-10-CM

## 2019-10-10 DIAGNOSIS — M79604 Pain in right leg: Secondary | ICD-10-CM

## 2019-10-28 ENCOUNTER — Ambulatory Visit
Admission: RE | Admit: 2019-10-28 | Discharge: 2019-10-28 | Disposition: A | Discharge status: Home / Self Care | Payer: BC Managed Care – PPO | Source: Ambulatory Visit | Attending: Orthopedic Surgery | Admitting: Orthopedic Surgery

## 2019-10-28 ENCOUNTER — Other Ambulatory Visit: Payer: Self-pay

## 2019-10-28 DIAGNOSIS — M79671 Pain in right foot: Secondary | ICD-10-CM

## 2021-12-10 ENCOUNTER — Emergency Department (HOSPITAL_COMMUNITY): Payer: Self-pay

## 2021-12-10 ENCOUNTER — Encounter (HOSPITAL_COMMUNITY): Payer: Self-pay

## 2021-12-10 ENCOUNTER — Emergency Department (HOSPITAL_COMMUNITY)
Admission: EM | Admit: 2021-12-10 | Discharge: 2021-12-10 | Disposition: A | Discharge status: Home / Self Care | Payer: Self-pay | Attending: Emergency Medicine | Admitting: Emergency Medicine

## 2021-12-10 ENCOUNTER — Other Ambulatory Visit: Payer: Self-pay

## 2021-12-10 DIAGNOSIS — W010XXA Fall on same level from slipping, tripping and stumbling without subsequent striking against object, initial encounter: Secondary | ICD-10-CM | POA: Insufficient documentation

## 2021-12-10 DIAGNOSIS — Z96651 Presence of right artificial knee joint: Secondary | ICD-10-CM | POA: Insufficient documentation

## 2021-12-10 DIAGNOSIS — S80211A Abrasion, right knee, initial encounter: Secondary | ICD-10-CM | POA: Insufficient documentation

## 2021-12-10 DIAGNOSIS — Y92810 Car as the place of occurrence of the external cause: Secondary | ICD-10-CM | POA: Insufficient documentation

## 2021-12-10 DIAGNOSIS — W19XXXA Unspecified fall, initial encounter: Secondary | ICD-10-CM

## 2021-12-10 DIAGNOSIS — M7989 Other specified soft tissue disorders: Secondary | ICD-10-CM | POA: Insufficient documentation

## 2021-12-10 MED ORDER — LIDOCAINE 5 % EX PTCH: 1.0000 | MEDICATED_PATCH | CUTANEOUS | 0 refills | Status: AC

## 2021-12-10 MED ORDER — NAPROXEN 500 MG PO TABS: 500.0000 mg | ORAL_TABLET | Freq: Two times a day (BID) | ORAL | 0 refills | Status: AC

## 2021-12-10 NOTE — ED Triage Notes (Signed)
Pt arrived POV c/o a fall. [place] and was getting out the car and slipped on a banana peel and landed on her right knee. Pt has an abrasion and some swelling to the right now. Pt has a hx of a knee replacement in that knee.

## 2021-12-10 NOTE — Progress Notes (Signed)
Orthopedic Tech Progress Note Patient Details:  Jodi Gray [birth year] [id number]  Knee sleeve applied to the pt. Pt stated that it felt comfortable and did not think they needed a bigger size. Crutch training was conducted with the pt showing proper crutch ambulation.    Ortho Devices Type of Ortho Device: Knee Sleeve, Crutches Ortho Device/Splint Location: RLE Ortho Device/Splint Interventions: Ordered, Application, Adjustment   Post Interventions Patient Tolerated: Well Instructions Provided: Adjustment of device, Care of device, Poper ambulation with device  Georg Ruddle 12/10/2021, 7:10 PM

## 2021-12-10 NOTE — Discharge Instructions (Addendum)
Follow-up with your orthopedist  Return for new or worsening symptoms

## 2021-12-10 NOTE — ED Provider Notes (Signed)
[facility] EMERGENCY DEPARTMENT Provider Note   CSN: [id number] Arrival date & time: 12/10/21  1243     History  Chief Complaint  Patient presents with   Jodi Gray is a 51 y.o. female with history significant for prior meniscal tear bilateral knee, s/p knee replacement on right here for evaluation of fall.  Tripped and fell getting out of a car.  Denies hitting head, LOC or anticoagulation.  No midline C/T/L tenderness.  She fell directly onto her right knee.  She is noted some soft tissue swelling and abrasion.  No pain to femur, tib-fib.  Ambulatory with limp since the fall.  No redness, warmth or drainage. No N/ weakness.  HPI     Home Medications Prior to Admission medications   Medication Sig Start Date End Date Taking? Authorizing Provider  lidocaine (LIDODERM) 5 % Place 1 patch onto the skin daily. Remove & Discard patch within 12 hours or as directed by MD 12/10/21  Yes Khaleesi Gruel A, PA-C  naproxen (NAPROSYN) 500 MG tablet Take 1 tablet (500 mg total) by mouth 2 (two) times daily. 12/10/21  Yes Shermon Bozzi A, PA-C  bisoprolol-hydrochlorothiazide (ZIAC) 2.5-6.25 MG tablet Take 1 tablet by mouth daily. 09/08/15   [provider]  cetirizine (ZYRTEC) 10 MG tablet Take 10 mg by mouth daily as needed for allergies.    [provider]  gabapentin (NEURONTIN) 100 MG capsule Take 100 mg by mouth 2 (two) times daily. 07/30/19   [provider]  HYDROcodone-acetaminophen (NORCO) 5-325 MG tablet Take 1 tablet by mouth every 6 (six) hours as needed for moderate pain. 01/24/17   Vickki Hearing, MD  ibuprofen (ADVIL,MOTRIN) 800 MG tablet Take 1 tablet (800 mg total) by mouth every 8 (eight) hours as needed. 10/03/16   Vickki Hearing, MD  methocarbamol (ROBAXIN) 500 MG tablet Take 1 tablet (500 mg total) every 6 (six) hours as needed by mouth for muscle spasms. 11/14/16   Vickki Hearing, MD  nitrofurantoin,  macrocrystal-monohydrate, (MACROBID) 100 MG capsule Take 1 capsule (100 mg total) by mouth 2 (two) times daily. 09/30/19   Avegno, Zachery Dakins, FNP  nortriptyline (PAMELOR) 10 MG capsule Take 1 capsule (10 mg total) at bedtime by mouth. 11/14/16   Vickki Hearing, MD  phenazopyridine (PYRIDIUM) 100 MG tablet Take 1 tablet (100 mg total) by mouth 3 (three) times daily as needed for pain. 09/30/19   Avegno, Zachery Dakins, FNP  topiramate (TOPAMAX) 50 MG tablet TAKE ONE TABLET (50 MG) BY ORAL ROUTE TWO TIMES PER DAY 07/07/16   [provider]      Allergies    Lisinopril and Shellfish allergy    Review of Systems   Review of Systems  Constitutional: Negative.   HENT: Negative.    Respiratory: Negative.    Cardiovascular: Negative.   Gastrointestinal: Negative.   Genitourinary: Negative.   Musculoskeletal:        Right knee pain  Skin: Negative.   Neurological: Negative.   All other systems reviewed and are negative.   Physical Exam Updated Vital Signs BP (!) 135/95   Pulse 75   Temp 98.5 F (36.9 C)   Resp 17   Ht 5\' 6"  (1.676 m)   Wt 78 kg   LMP  (LMP Unknown)   SpO2 98%   BMI 27.76 kg/m  Physical Exam Vitals and nursing note reviewed.  Constitutional:      General: She is  not in acute distress.    Appearance: She is well-developed. She is not ill-appearing, toxic-appearing or diaphoretic.  HENT:     Head: Normocephalic and atraumatic.     Mouth/Throat:     Mouth: Mucous membranes are moist.  Eyes:     Pupils: Pupils are equal, round, and reactive to light.  Cardiovascular:     Rate and Rhythm: Normal rate.     Pulses: Normal pulses.  Pulmonary:     Effort: Pulmonary effort is normal. No respiratory distress.     Breath sounds: Normal breath sounds.  Abdominal:     General: There is no distension.  Musculoskeletal:        General: Normal range of motion.     Cervical back: Normal range of motion.     Comments: No midline C/T/L tenderness. Pelvis stable,  non tender to palpation.  No shortening rotation of legs.  Nontender bilateral femur, tib-fib.  Diffuse anterior right knee pain with overlying soft tissue swelling and abrasion.  Old midline surgical scar without any wound dehiscence, erythema or warmth.  Able to flex and extend at right knee without difficulty  Skin:    General: Skin is warm and dry.     Capillary Refill: Capillary refill takes less than 2 seconds.     Comments: Soft tissue swelling to right anterior knee, no obvious effusion. No lacerations to suture  Neurological:     General: No focal deficit present.     Mental Status: She is alert.     Comments: Intact sensation Ambulatory with limp 2/2 pain  Psychiatric:        Mood and Affect: Mood normal.     ED Results / Procedures / Treatments   Labs (all labs ordered are listed, but only abnormal results are displayed) Labs Reviewed - No data to display  EKG None  Radiology DG Knee 2 Views Right  Result Date: 12/10/2021 CLINICAL DATA:  Patient slipped and fell today.  Pain. EXAM: RIGHT KNEE - 1-2 VIEW COMPARISON:  None Available. FINDINGS: The patient is status post knee replacement. Hardware is in good position. No fracture is identified. There is a small joint effusion. Anterior soft tissue swelling is identified. IMPRESSION: Anterior soft tissue swelling. Small joint effusion. No fracture or dislocation. Electronically Signed   By: Dorise Bullion III M.D.   On: 12/10/2021 14:34    Procedures .Ortho Injury Treatment  Date/Time: 12/10/2021 6:59 PM  Performed by: Nettie Elm, PA-C Authorized by: Nettie Elm, PA-C   Consent:    Consent obtained:  Verbal   Consent given by:  Patient   Risks discussed:  Nerve damage, restricted joint movement, vascular damage, stiffness, recurrent dislocation, irreducible dislocation and fracture   Alternatives discussed:  No treatment, alternative treatment, immobilization, referral and delayed treatmentInjury location:  knee Location details: right knee Injury type: soft tissue Pre-procedure neurovascular assessment: neurovascularly intact Pre-procedure distal perfusion: normal Pre-procedure neurological function: normal Pre-procedure range of motion: normal  Anesthesia: Local anesthesia used: no  Patient sedated: NoImmobilization: crutches and splint Splint Applied by: Sheliah Hatch Post-procedure neurovascular assessment: post-procedure neurovascularly intact Post-procedure distal perfusion: normal Post-procedure neurological function: normal Post-procedure range of motion: normal       Medications Ordered in ED Medications - No data to display  ED Course/ Medical Decision Making/ A&P    51 year old here for mechanical fall which occurred just PTA.  Denies any head, LOC or anticoagulation.  No midline C/T/L tenderness.  Fell directly onto anterior aspect  of her right knee.  Has had prior surgery here.  On exam she has some tenderness overlying soft tissue swelling however no obvious large effusion.  Nontender tib-fib, femur, pelvis.  No lacerations to suture.  She does have abrasion.  Tetanus is up-to-date.  She is ambulatory with limp.  Negative varus, valgus stress.  Full range of motion.  Imaging personally viewed and interpreted:  X-ray right knee with small joint effusion, hardware in good position.  No fracture or dislocation.  There is anterior soft tissue swelling  I discussed imaging with patient.  Will provide crutches, knee sleeve.  Discussed NSAIDs, lidocaine patches, RICE for symptomatic management and she will follow-up with her surgeon.  Agreeable for outpatient follow-up.  Ambulatory.  The patient has been appropriately medically screened and/or stabilized in the ED. I have low suspicion for any other emergent medical condition which would require further screening, evaluation or treatment in the ED or require inpatient management.  Patient is hemodynamically stable and in no  acute distress.  Patient able to ambulate in department prior to ED.  Evaluation does not show acute pathology that would require ongoing or additional emergent interventions while in the emergency department or further inpatient treatment.  I have discussed the diagnosis with the patient and answered all questions.  Pain is been managed while in the emergency department and patient has no further complaints prior to discharge.  Patient is comfortable with plan discussed in room and is stable for discharge at this time.  I have discussed strict return precautions for returning to the emergency department.  Patient was encouraged to follow-up with PCP/specialist refer to at discharge.                            Medical Decision Making Amount and/or Complexity of Data Reviewed External Data Reviewed: labs, radiology and notes. Radiology: ordered and independent interpretation performed. Decision-making details documented in ED Course.  Risk OTC drugs. Prescription drug management. Decision regarding hospitalization. Diagnosis or treatment significantly limited by social determinants of health.           Final Clinical Impression(s) / ED Diagnoses Final diagnoses:  Fall, initial encounter    Rx / DC Orders ED Discharge Orders          Ordered    naproxen (NAPROSYN) 500 MG tablet  2 times daily        12/10/21 1846    lidocaine (LIDODERM) 5 %  Every 24 hours        12/10/21 1846              Rivka Baune A, PA-C 12/10/21 1951    Fransico Meadow, MD 12/12/21 1334

## 2023-04-12 ENCOUNTER — Other Ambulatory Visit: Payer: Self-pay | Admitting: Nurse Practitioner

## 2023-04-12 ENCOUNTER — Ambulatory Visit
Admission: RE | Admit: 2023-04-12 | Discharge: 2023-04-12 | Disposition: A | Discharge status: Home / Self Care | Source: Ambulatory Visit | Attending: Nurse Practitioner | Admitting: Nurse Practitioner

## 2023-04-12 DIAGNOSIS — M79671 Pain in right foot: Secondary | ICD-10-CM

## 2023-04-12 DIAGNOSIS — M25562 Pain in left knee: Secondary | ICD-10-CM

## 2023-04-12 DIAGNOSIS — M25561 Pain in right knee: Secondary | ICD-10-CM
